# Patient Record
Sex: Female | Born: 1960 | Race: White | Hispanic: No | Marital: Married | Smoking: Never smoker
Health system: Southern US, Community
[De-identification: ages and names within clinical notes are randomized; demographics above are authoritative.]

## PROBLEM LIST (undated history)

## (undated) DIAGNOSIS — G4730 Sleep apnea, unspecified: Secondary | ICD-10-CM

## (undated) DIAGNOSIS — G608 Other hereditary and idiopathic neuropathies: Secondary | ICD-10-CM

## (undated) DIAGNOSIS — Z98811 Dental restoration status: Secondary | ICD-10-CM

## (undated) DIAGNOSIS — Z973 Presence of spectacles and contact lenses: Secondary | ICD-10-CM

## (undated) DIAGNOSIS — F419 Anxiety disorder, unspecified: Secondary | ICD-10-CM

## (undated) DIAGNOSIS — A6000 Herpesviral infection of urogenital system, unspecified: Secondary | ICD-10-CM

## (undated) DIAGNOSIS — E785 Hyperlipidemia, unspecified: Secondary | ICD-10-CM

## (undated) DIAGNOSIS — K5792 Diverticulitis of intestine, part unspecified, without perforation or abscess without bleeding: Secondary | ICD-10-CM

## (undated) DIAGNOSIS — F32A Depression, unspecified: Secondary | ICD-10-CM

## (undated) DIAGNOSIS — I10 Essential (primary) hypertension: Secondary | ICD-10-CM

## (undated) DIAGNOSIS — F329 Major depressive disorder, single episode, unspecified: Secondary | ICD-10-CM

## (undated) HISTORY — DX: Essential (primary) hypertension: I10

## (undated) HISTORY — DX: Herpesviral infection of urogenital system, unspecified: A60.00

## (undated) HISTORY — DX: Hyperlipidemia, unspecified: E78.5

## (undated) HISTORY — DX: Anxiety disorder, unspecified: F41.9

## (undated) HISTORY — DX: Depression, unspecified: F32.A

## (undated) HISTORY — DX: Diverticulitis of intestine, part unspecified, without perforation or abscess without bleeding: K57.92

## (undated) HISTORY — PX: TONSILLECTOMY: SUR1361

## (undated) HISTORY — DX: Other hereditary and idiopathic neuropathies: G60.8

## (undated) HISTORY — DX: Major depressive disorder, single episode, unspecified: F32.9

---

## 2004-05-16 ENCOUNTER — Ambulatory Visit: Payer: Self-pay | Admitting: Obstetrics and Gynecology

## 2005-06-12 ENCOUNTER — Ambulatory Visit: Payer: Self-pay | Admitting: Obstetrics and Gynecology

## 2008-06-23 ENCOUNTER — Ambulatory Visit: Payer: Self-pay | Admitting: Obstetrics and Gynecology

## 2010-07-20 ENCOUNTER — Ambulatory Visit: Payer: Self-pay | Admitting: Obstetrics and Gynecology

## 2010-07-21 ENCOUNTER — Ambulatory Visit: Payer: Self-pay | Admitting: Obstetrics and Gynecology

## 2011-08-09 ENCOUNTER — Ambulatory Visit: Payer: Self-pay | Admitting: Obstetrics and Gynecology

## 2011-09-17 LAB — HM PAP SMEAR

## 2011-12-27 ENCOUNTER — Ambulatory Visit: Payer: Self-pay | Admitting: Gastroenterology

## 2011-12-27 LAB — HM COLONOSCOPY

## 2014-10-11 ENCOUNTER — Other Ambulatory Visit: Payer: Self-pay | Admitting: Unknown Physician Specialty

## 2014-10-15 ENCOUNTER — Ambulatory Visit: Payer: Self-pay | Admitting: Unknown Physician Specialty

## 2014-10-15 DIAGNOSIS — F419 Anxiety disorder, unspecified: Secondary | ICD-10-CM | POA: Insufficient documentation

## 2014-10-15 DIAGNOSIS — F329 Major depressive disorder, single episode, unspecified: Secondary | ICD-10-CM

## 2014-10-15 DIAGNOSIS — I10 Essential (primary) hypertension: Secondary | ICD-10-CM | POA: Insufficient documentation

## 2014-10-15 DIAGNOSIS — E1159 Type 2 diabetes mellitus with other circulatory complications: Secondary | ICD-10-CM | POA: Insufficient documentation

## 2014-10-15 DIAGNOSIS — E785 Hyperlipidemia, unspecified: Secondary | ICD-10-CM | POA: Insufficient documentation

## 2014-10-15 DIAGNOSIS — A6000 Herpesviral infection of urogenital system, unspecified: Secondary | ICD-10-CM | POA: Insufficient documentation

## 2014-10-15 DIAGNOSIS — E1169 Type 2 diabetes mellitus with other specified complication: Secondary | ICD-10-CM | POA: Insufficient documentation

## 2014-10-15 DIAGNOSIS — F32A Depression, unspecified: Secondary | ICD-10-CM | POA: Insufficient documentation

## 2014-10-18 HISTORY — PX: DRAINAGE TUBE REMOVAL (ARMC HX): HXRAD1702

## 2014-11-03 ENCOUNTER — Encounter: Payer: Self-pay | Admitting: Unknown Physician Specialty

## 2014-12-07 ENCOUNTER — Ambulatory Visit (INDEPENDENT_AMBULATORY_CARE_PROVIDER_SITE_OTHER): Payer: BC Managed Care – PPO | Admitting: Unknown Physician Specialty

## 2014-12-07 ENCOUNTER — Encounter: Payer: Self-pay | Admitting: Unknown Physician Specialty

## 2014-12-07 VITALS — BP 90/72 | HR 91 | Temp 98.7°F | Ht 65.5 in | Wt 178.6 lb

## 2014-12-07 DIAGNOSIS — Z0000 Encounter for general adult medical examination without abnormal findings: Secondary | ICD-10-CM

## 2014-12-07 DIAGNOSIS — B354 Tinea corporis: Secondary | ICD-10-CM | POA: Diagnosis not present

## 2014-12-07 DIAGNOSIS — I10 Essential (primary) hypertension: Secondary | ICD-10-CM

## 2014-12-07 DIAGNOSIS — F329 Major depressive disorder, single episode, unspecified: Secondary | ICD-10-CM | POA: Diagnosis not present

## 2014-12-07 DIAGNOSIS — A6000 Herpesviral infection of urogenital system, unspecified: Secondary | ICD-10-CM

## 2014-12-07 DIAGNOSIS — F32A Depression, unspecified: Secondary | ICD-10-CM

## 2014-12-07 MED ORDER — ITRACONAZOLE 100 MG PO CAPS: 200.0000 mg | ORAL_CAPSULE | Freq: Every day | ORAL | Status: DC

## 2014-12-07 MED ORDER — LOSARTAN POTASSIUM-HCTZ 50-12.5 MG PO TABS: 1.0000 | ORAL_TABLET | Freq: Every day | ORAL | Status: DC

## 2014-12-07 MED ORDER — FLUOXETINE HCL 20 MG PO CAPS: 20.0000 mg | ORAL_CAPSULE | Freq: Every day | ORAL | Status: DC

## 2014-12-07 MED ORDER — VALACYCLOVIR HCL 500 MG PO TABS: 500.0000 mg | ORAL_TABLET | Freq: Every day | ORAL | Status: DC

## 2014-12-07 NOTE — Assessment & Plan Note (Addendum)
SBP 90.  Feels OK without dizzyness but lost 10 pounds while in the hospital.  Will decrease BP meds from Losartan/HCTZ 100/25 to 50/12.5.  She wants to keep the weight off

## 2014-12-07 NOTE — Patient Instructions (Signed)
Please do call to schedule your mammogram; the number to schedule one at either [facility] or [facility] is [phone]   

## 2014-12-07 NOTE — Assessment & Plan Note (Signed)
Chronic.  Refill Valtrex

## 2014-12-07 NOTE — Assessment & Plan Note (Signed)
Stable.  Refill Fluoxetine 

## 2014-12-07 NOTE — Progress Notes (Signed)
BP 90/72 mmHg  Pulse 91  Temp(Src) 98.7 F (37.1 C)  Ht 5' 5.5" (1.664 m)  Wt 178 lb 9.6 oz (81.012 kg)  BMI 29.26 kg/m2  SpO2 97%  LMP 01/11/2010 (Approximate)   Subjective:    Patient ID: Jo Martinez, female    DOB: [birth year], 54 y.o.   MRN: [id number]  HPI: Jo Martinez is a 54 y.o. female  Chief Complaint  Patient presents with  . Annual Exam   Was in the hospital for diverticulitis for a week in August.  She is recovered.  No surgery.    Hypertension This is a chronic problem. The current episode started today. The problem is unchanged. The problem is controlled. Pertinent negatives include no chest pain, neck pain, shortness of breath or sweats. The current treatment provides significant improvement. There are no compliance problems.    Depression Stable Depression screen PHQ 2/9 12/07/2014  Decreased Interest 0  Down, Depressed, Hopeless 0  PHQ - 2 Score 0     Relevant past medical, surgical, family and social history reviewed and updated as indicated. Interim medical history since our last visit reviewed. Allergies and medications reviewed and updated.  Review of Systems  Constitutional: Negative.   HENT: Negative.   Eyes: Negative.   Respiratory: Negative.  Negative for shortness of breath.   Cardiovascular: Negative.  Negative for chest pain.  Gastrointestinal: Negative.   Endocrine: Negative.   Genitourinary: Negative.   Musculoskeletal: Negative.  Negative for neck pain.  Skin:       Was given a cream for ringworm which seems to be Lotrisone.  It helped but stopped using it while in hospital  Allergic/Immunologic: Negative.   Neurological: Negative.   Hematological: Negative.   Psychiatric/Behavioral: Negative.     Per HPI unless specifically indicated above     Objective:    BP 90/72 mmHg  Pulse 91  Temp(Src) 98.7 F (37.1 C)  Ht 5' 5.5" (1.664 m)  Wt 178 lb 9.6 oz (81.012 kg)  BMI 29.26 kg/m2  SpO2 97%  LMP 01/11/2010  (Approximate)  Wt Readings from Last 3 Encounters:  12/07/14 178 lb 9.6 oz (81.012 kg)  05/04/14 185 lb (83.915 kg)    Physical Exam  Constitutional: She is oriented to person, place, and time. She appears well-developed and well-nourished.  HENT:  Head: Normocephalic and atraumatic.  Eyes: Pupils are equal, round, and reactive to light. Right eye exhibits no discharge. Left eye exhibits no discharge. No scleral icterus.  Neck: Normal range of motion. Neck supple. Carotid bruit is not present. No thyromegaly present.  Cardiovascular: Normal rate, regular rhythm and normal heart sounds.  Exam reveals no gallop and no friction rub.   No murmur heard. Pulmonary/Chest: Effort normal and breath sounds normal. No respiratory distress. She has no wheezes. She has no rales.  Abdominal: Soft. Bowel sounds are normal. There is no tenderness. There is no rebound.  Genitourinary: No breast swelling, tenderness or discharge.  Refusing pelvic  Musculoskeletal: Normal range of motion.  Lymphadenopathy:    She has no cervical adenopathy.  Neurological: She is alert and oriented to person, place, and time.  Skin: Skin is warm, dry and intact. No rash noted.  Multiple lesions on back and left side  Psychiatric: She has a normal mood and affect. Her speech is normal and behavior is normal. Judgment and thought content normal. Cognition and memory are normal.       Assessment & Plan:   Problem List  Items Addressed This Visit      Unprioritized   Hypertension    SBP 90.  Feels OK without dizzyness but lost 10 pounds while in the hospital.  Will decrease BP meds from Losartan/HCTZ 100/25 to 50/12.5.  She wants to keep the weight off      Relevant Medications   losartan-hydrochlorothiazide (HYZAAR) 50-12.5 MG tablet   Other Relevant Orders   Comprehensive metabolic panel   Lipid Panel w/o Chol/HDL Ratio   Depression    Stable.  Refill Fluoxetine      Relevant Medications   FLUoxetine (PROZAC)  20 MG capsule   Genital herpes    Chronic.  Refill Valtrex      Relevant Medications   itraconazole (SPORANOX) 100 MG capsule   valACYclovir (VALTREX) 500 MG tablet    Other Visit Diagnoses    Annual physical exam    -  Primary    Relevant Orders    CBC with Differential/Platelet    Comprehensive metabolic panel    Lipid Panel w/o Chol/HDL Ratio    TSH    HIV antibody    Hepatitis C antibody    MM DIGITAL SCREENING BILATERAL    Tinea corporis        Relevant Medications    itraconazole (SPORANOX) 100 MG capsule    valACYclovir (VALTREX) 500 MG tablet        Follow up plan: Return in about 3 months (around 03/09/2015).

## 2014-12-08 ENCOUNTER — Telehealth: Payer: Self-pay | Admitting: Unknown Physician Specialty

## 2014-12-08 DIAGNOSIS — D7589 Other specified diseases of blood and blood-forming organs: Secondary | ICD-10-CM

## 2014-12-08 DIAGNOSIS — R7301 Impaired fasting glucose: Secondary | ICD-10-CM

## 2014-12-08 LAB — CBC WITH DIFFERENTIAL/PLATELET
BASOS: 1 %
Basophils Absolute: 0 10*3/uL (ref 0.0–0.2)
EOS (ABSOLUTE): 0.2 10*3/uL (ref 0.0–0.4)
Eos: 2 %
HEMATOCRIT: 39.2 % (ref 34.0–46.6)
Hemoglobin: 13.7 g/dL (ref 11.1–15.9)
IMMATURE GRANS (ABS): 0 10*3/uL (ref 0.0–0.1)
Immature Granulocytes: 0 %
Lymphocytes Absolute: 3.7 10*3/uL — ABNORMAL HIGH (ref 0.7–3.1)
Lymphs: 42 %
MCH: 37 pg — AB (ref 26.6–33.0)
MCHC: 34.9 g/dL (ref 31.5–35.7)
MCV: 106 fL — AB (ref 79–97)
MONOCYTES: 7 %
Monocytes Absolute: 0.6 10*3/uL (ref 0.1–0.9)
NEUTROS ABS: 4.3 10*3/uL (ref 1.4–7.0)
Neutrophils: 48 %
PLATELETS: 255 10*3/uL (ref 150–379)
RBC: 3.7 x10E6/uL — ABNORMAL LOW (ref 3.77–5.28)
RDW: 15.8 % — ABNORMAL HIGH (ref 12.3–15.4)
WBC: 8.8 10*3/uL (ref 3.4–10.8)

## 2014-12-08 LAB — COMPREHENSIVE METABOLIC PANEL
A/G RATIO: 2.2 (ref 1.1–2.5)
ALBUMIN: 4.3 g/dL (ref 3.5–5.5)
ALT: 14 IU/L (ref 0–32)
AST: 18 IU/L (ref 0–40)
Alkaline Phosphatase: 63 IU/L (ref 39–117)
BILIRUBIN TOTAL: 0.6 mg/dL (ref 0.0–1.2)
BUN / CREAT RATIO: 12 (ref 9–23)
BUN: 11 mg/dL (ref 6–24)
CHLORIDE: 98 mmol/L (ref 97–108)
CO2: 23 mmol/L (ref 18–29)
Calcium: 9.1 mg/dL (ref 8.7–10.2)
Creatinine, Ser: 0.89 mg/dL (ref 0.57–1.00)
GFR calc non Af Amer: 75 mL/min/{1.73_m2} (ref >59)
GFR, EST AFRICAN AMERICAN: 86 mL/min/{1.73_m2} (ref >59)
GLOBULIN, TOTAL: 2 g/dL (ref 1.5–4.5)
Glucose: 149 mg/dL — ABNORMAL HIGH (ref 65–99)
POTASSIUM: 3.3 mmol/L — AB (ref 3.5–5.2)
SODIUM: 140 mmol/L (ref 134–144)
TOTAL PROTEIN: 6.3 g/dL (ref 6.0–8.5)

## 2014-12-08 LAB — LIPID PANEL W/O CHOL/HDL RATIO
Cholesterol, Total: 223 mg/dL — ABNORMAL HIGH (ref 100–199)
HDL: 41 mg/dL (ref >39)
LDL Calculated: 122 mg/dL — ABNORMAL HIGH (ref 0–99)
Triglycerides: 301 mg/dL — ABNORMAL HIGH (ref 0–149)
VLDL Cholesterol Cal: 60 mg/dL — ABNORMAL HIGH (ref 5–40)

## 2014-12-08 LAB — HIV ANTIBODY (ROUTINE TESTING W REFLEX): HIV Screen 4th Generation wRfx: NONREACTIVE

## 2014-12-08 LAB — TSH: TSH: 4.11 u[IU]/mL (ref 0.450–4.500)

## 2014-12-08 LAB — HEPATITIS C ANTIBODY: Hep C Virus Ab: <0.1 s/co ratio (ref 0.0–0.9)

## 2014-12-08 NOTE — Telephone Encounter (Signed)
Discussed labs with patient.  We need a Hgb A1C, B12, and folate.  She will come to get those drawn.  Continue to monitor cholesterol at next visit.

## 2014-12-14 ENCOUNTER — Other Ambulatory Visit: Payer: BC Managed Care – PPO

## 2014-12-14 DIAGNOSIS — D7589 Other specified diseases of blood and blood-forming organs: Secondary | ICD-10-CM

## 2014-12-14 DIAGNOSIS — R7301 Impaired fasting glucose: Secondary | ICD-10-CM

## 2014-12-14 LAB — BAYER DCA HB A1C WAIVED: HB A1C (BAYER DCA - WAIVED): 5.5 % (ref <7.0)

## 2014-12-15 ENCOUNTER — Encounter: Payer: Self-pay | Admitting: Unknown Physician Specialty

## 2014-12-15 ENCOUNTER — Telehealth: Payer: Self-pay

## 2014-12-15 DIAGNOSIS — D519 Vitamin B12 deficiency anemia, unspecified: Secondary | ICD-10-CM

## 2014-12-15 LAB — VITAMIN B12: VITAMIN B 12: 165 pg/mL — AB (ref 211–946)

## 2014-12-15 LAB — FOLATE

## 2014-12-15 MED ORDER — CYANOCOBALAMIN 1000 MCG/ML IJ SOLN: 1000.0000 ug | Freq: Once | INTRAMUSCULAR | Status: DC

## 2014-12-15 NOTE — Telephone Encounter (Signed)
Patient called and stated she was returning Jo Martinez's call.

## 2014-12-15 NOTE — Telephone Encounter (Signed)
Discussed with pt she has a B12 deficiency.  I recommend weekly injection for 4 weeks and then monthly

## 2014-12-16 ENCOUNTER — Other Ambulatory Visit: Payer: Self-pay | Admitting: Unknown Physician Specialty

## 2014-12-17 ENCOUNTER — Encounter: Payer: Self-pay | Admitting: Unknown Physician Specialty

## 2014-12-27 ENCOUNTER — Other Ambulatory Visit: Payer: Self-pay | Admitting: Unknown Physician Specialty

## 2014-12-27 ENCOUNTER — Encounter: Payer: Self-pay | Admitting: Unknown Physician Specialty

## 2014-12-27 MED ORDER — TERBINAFINE HCL 250 MG PO TABS: 250.0000 mg | ORAL_TABLET | Freq: Every day | ORAL | Status: DC

## 2015-02-15 ENCOUNTER — Other Ambulatory Visit: Payer: Self-pay | Admitting: Unknown Physician Specialty

## 2015-03-28 ENCOUNTER — Other Ambulatory Visit: Payer: Self-pay | Admitting: Unknown Physician Specialty

## 2015-03-28 ENCOUNTER — Encounter: Payer: Self-pay | Admitting: Unknown Physician Specialty

## 2015-03-28 MED ORDER — ITRACONAZOLE 100 MG PO CAPS: 200.0000 mg | ORAL_CAPSULE | Freq: Every day | ORAL | Status: DC

## 2015-03-29 ENCOUNTER — Other Ambulatory Visit: Payer: Self-pay | Admitting: Unknown Physician Specialty

## 2015-03-29 MED ORDER — ITRACONAZOLE 100 MG PO CAPS: 200.0000 mg | ORAL_CAPSULE | Freq: Every day | ORAL | Status: DC

## 2015-03-29 NOTE — Telephone Encounter (Signed)
Pt needs sporanox sent to [facility] [address].  new mail order service is [facility] not [facility]

## 2015-03-29 NOTE — Telephone Encounter (Signed)
Routing to provider  

## 2015-04-19 ENCOUNTER — Encounter: Payer: Self-pay | Admitting: Unknown Physician Specialty

## 2015-04-19 ENCOUNTER — Ambulatory Visit (INDEPENDENT_AMBULATORY_CARE_PROVIDER_SITE_OTHER): Payer: BC Managed Care – PPO | Admitting: Unknown Physician Specialty

## 2015-04-19 VITALS — BP 145/88 | HR 91 | Temp 99.1°F | Ht 65.2 in | Wt 176.2 lb

## 2015-04-19 DIAGNOSIS — B354 Tinea corporis: Secondary | ICD-10-CM | POA: Diagnosis not present

## 2015-04-19 DIAGNOSIS — I10 Essential (primary) hypertension: Secondary | ICD-10-CM

## 2015-04-19 MED ORDER — LOSARTAN POTASSIUM-HCTZ 100-25 MG PO TABS: 1.0000 | ORAL_TABLET | Freq: Every day | ORAL | Status: DC

## 2015-04-19 MED ORDER — ITRACONAZOLE 100 MG PO CAPS: 200.0000 mg | ORAL_CAPSULE | Freq: Every day | ORAL | Status: DC

## 2015-04-19 NOTE — Assessment & Plan Note (Signed)
Not to goal.  Will increase to Hyzaar 100/25 mg

## 2015-04-19 NOTE — Progress Notes (Signed)
   BP 145/88 mmHg  Pulse 91  Temp(Src) 99.1 F (37.3 C)  Ht 5' 5.2" (1.656 m)  Wt 176 lb 3.2 oz (79.924 kg)  BMI 29.14 kg/m2  SpO2 98%  LMP 01/16/2010 (Approximate)   Subjective:    Patient ID: Jo Martinez, female    DOB: 1960/07/21, 55 y.o.   MRN: [id number]  HPI: Jo Martinez is a 55 y.o. female  Chief Complaint  Patient presents with  . Hypertension  . Medication Refill    pt states she needs refill on losartan hctz and itraconazole   Hypertension Using medications without difficulty Average home BPs "not checking"  No problems or lightheadedness No chest pain with exertion or shortness of breath No Edema  Ringworm Gave Itraconazole last visit due to multiple areas of ringworm.  She said it alsmost went away but has come back.  She has had a round of Itraconazole.    Relevant past medical, surgical, family and social history reviewed and updated as indicated. Interim medical history since our last visit reviewed. Allergies and medications reviewed and updated.  Review of Systems  Per HPI unless specifically indicated above     Objective:    BP 145/88 mmHg  Pulse 91  Temp(Src) 99.1 F (37.3 C)  Ht 5' 5.2" (1.656 m)  Wt 176 lb 3.2 oz (79.924 kg)  BMI 29.14 kg/m2  SpO2 98%  LMP 01/16/2010 (Approximate)  Wt Readings from Last 3 Encounters:  04/19/15 176 lb 3.2 oz (79.924 kg)  12/07/14 178 lb 9.6 oz (81.012 kg)  05/04/14 185 lb (83.915 kg)    Physical Exam  Constitutional: She is oriented to person, place, and time. She appears well-developed and well-nourished. No distress.  HENT:  Head: Normocephalic and atraumatic.  Eyes: Conjunctivae and lids are normal. Right eye exhibits no discharge. Left eye exhibits no discharge. No scleral icterus.  Neck: Normal range of motion. Neck supple. No JVD present. Carotid bruit is not present.  Cardiovascular: Normal rate, regular rhythm and normal heart sounds.   Pulmonary/Chest: Effort normal and breath sounds  normal.  Abdominal: Normal appearance. There is no splenomegaly or hepatomegaly.  Musculoskeletal: Normal range of motion.  Neurological: She is alert and oriented to person, place, and time.  Skin: Skin is warm, dry and intact. Rash noted. No pallor.  Tinea under breasts and upper legs  Psychiatric: She has a normal mood and affect. Her behavior is normal. Judgment and thought content normal.      Assessment & Plan:   Problem List Items Addressed This Visit      Unprioritized   Hypertension - Primary    Not to goal.  Will increase to Hyzaar 100/25 mg      Relevant Medications   losartan-hydrochlorothiazide (HYZAAR) 100-25 MG tablet   Ringworm of body    Refill Sporanox.  Pt does not want to consider another option      Relevant Medications   itraconazole (SPORANOX) 100 MG capsule      Last Hgb A1C was 5.5  Follow up plan: Return in about 4 weeks (around 05/17/2015).

## 2015-04-19 NOTE — Assessment & Plan Note (Signed)
Refill Sporanox.  Pt does not want to consider another option

## 2015-05-15 ENCOUNTER — Other Ambulatory Visit: Payer: Self-pay | Admitting: Family Medicine

## 2015-06-24 ENCOUNTER — Ambulatory Visit (INDEPENDENT_AMBULATORY_CARE_PROVIDER_SITE_OTHER): Payer: BC Managed Care – PPO | Admitting: Unknown Physician Specialty

## 2015-06-24 ENCOUNTER — Encounter: Payer: Self-pay | Admitting: Unknown Physician Specialty

## 2015-06-24 VITALS — BP 132/86 | HR 69 | Temp 98.4°F | Ht 65.5 in | Wt 173.8 lb

## 2015-06-24 DIAGNOSIS — K5732 Diverticulitis of large intestine without perforation or abscess without bleeding: Secondary | ICD-10-CM | POA: Diagnosis not present

## 2015-06-24 NOTE — Progress Notes (Signed)
BP 132/86 mmHg  Pulse 69  Temp(Src) 98.4 F (36.9 C)  Ht 5' 5.5" (1.664 m)  Wt 173 lb 12.8 oz (78.835 kg)  BMI 28.47 kg/m2  SpO2 97%  LMP 01/16/2010 (Approximate)   Subjective:    Patient ID: Jo Martinez, female    DOB: [birth year], 55 y.o.   MRN: [id number]  HPI: Jo Martinez is a 55 y.o. female  Chief Complaint  Patient presents with  . Hospitalization Follow-up    pt [facility]    Patient present in clinic for f/u from [facility] related to diverticulosis. The patient reports the pain localized at the lower middle abdomen with nausea. Patient was given ciprofloxacin and metronidazole for treatment. Patient reports today is the last day of antibiotic regimen. Patient reports improvement in abdomenal pain; however,c/o GI upset from the antibiotic adverse effects.   Abdominal Pain: Resolved abdominal pain from ER visitation Denies fevers or chills Reports nausea and diarrhea due to antibiotics. Denies generalized weakness or near syncope Last colonoscopy was 4 years ago. This is her second episode. First episode was in August 2016 with 5 day hospitalization.   Relevant past medical, surgical, family and social history reviewed and updated as indicated. Interim medical history since our last visit reviewed. Allergies and medications reviewed and updated.  Review of Systems  Constitutional: Negative for fever, activity change, appetite change, fatigue and unexpected weight change.  Respiratory: Negative.   Cardiovascular: Negative.   Gastrointestinal: Positive for nausea and diarrhea. Negative for vomiting, abdominal pain, blood in stool and abdominal distention.  Genitourinary: Negative.   Psychiatric/Behavioral: Negative.   All other systems reviewed and are negative.   Per HPI unless specifically indicated above     Objective:    BP 132/86 mmHg  Pulse 69  Temp(Src) 98.4 F (36.9 C)  Ht  5' 5.5" (1.664 m)  Wt 173 lb 12.8 oz (78.835 kg)  BMI 28.47 kg/m2  SpO2 97%  LMP 01/16/2010 (Approximate)  Wt Readings from Last 3 Encounters:  06/24/15 173 lb 12.8 oz (78.835 kg)  04/19/15 176 lb 3.2 oz (79.924 kg)  12/07/14 178 lb 9.6 oz (81.012 kg)    Physical Exam  Constitutional: She is oriented to person, place, and time. She appears well-developed and well-nourished. No distress.  HENT:  Head: Normocephalic and atraumatic.  Eyes: Conjunctivae and EOM are normal. Pupils are equal, round, and reactive to light.  Neck: Normal range of motion.  Cardiovascular: Normal rate, regular rhythm, normal heart sounds and intact distal pulses.   Pulmonary/Chest: Effort normal and breath sounds normal. No respiratory distress. She exhibits no tenderness.  Abdominal: Soft. Bowel sounds are normal. She exhibits no distension and no mass. There is no tenderness. There is no rebound.  Musculoskeletal: Normal range of motion.  Neurological: She is alert and oriented to person, place, and time.  Skin: Skin is warm and dry.  Psychiatric: She has a normal mood and affect. Her behavior is normal. Judgment and thought content normal.    Results for orders placed or performed in visit on 12/14/14  Vitamin B12  Result Value Ref Range   Vitamin B-12 165 (L) 211 - 946 pg/mL  Folate  Result Value Ref Range   Folate <2.0 (L) >3.0 ng/mL  Bayer DCA Hb A1c Waived  Result Value Ref Range   Bayer DCA Hb A1c Waived 5.5 <7.0 %      Assessment &  Plan:   Problem List Items Addressed This Visit      Unprioritized   Diverticulitis of large intestine without perforation or abscess without bleeding - Primary    Reviewed the documents from ER visit with noted Diverticulitis confirmed on Xray and CT scan. Patient reports improvement. Referred to GI for further evaluation.      Relevant Medications   ciprofloxacin (CIPRO) 500 MG tablet   metroNIDAZOLE (FLAGYL) 500 MG tablet   Other Relevant Orders    Ambulatory referral to Gastroenterology       Follow up plan: Return if symptoms worsen or fail to improve.

## 2015-06-24 NOTE — Assessment & Plan Note (Addendum)
Reviewed the documents from ER visit with noted Diverticulitis confirmed on Xray and CT scan. Patient reports improvement. Referred to GI for further evaluation.

## 2015-07-05 ENCOUNTER — Encounter: Payer: Self-pay | Admitting: *Deleted

## 2015-07-06 ENCOUNTER — Encounter
Admission: RE | Disposition: A | Discharge status: Home / Self Care | Payer: Self-pay | Source: Ambulatory Visit | Attending: Gastroenterology

## 2015-07-06 ENCOUNTER — Ambulatory Visit
Admission: RE | Admit: 2015-07-06 | Discharge: 2015-07-06 | Disposition: A | Discharge status: Home / Self Care | Payer: BC Managed Care – PPO | Source: Ambulatory Visit | Attending: Gastroenterology | Admitting: Gastroenterology

## 2015-07-06 ENCOUNTER — Ambulatory Visit: Payer: BC Managed Care – PPO | Admitting: Anesthesiology

## 2015-07-06 DIAGNOSIS — Z79899 Other long term (current) drug therapy: Secondary | ICD-10-CM | POA: Diagnosis not present

## 2015-07-06 DIAGNOSIS — K5730 Diverticulosis of large intestine without perforation or abscess without bleeding: Secondary | ICD-10-CM | POA: Diagnosis present

## 2015-07-06 DIAGNOSIS — I10 Essential (primary) hypertension: Secondary | ICD-10-CM | POA: Insufficient documentation

## 2015-07-06 DIAGNOSIS — F329 Major depressive disorder, single episode, unspecified: Secondary | ICD-10-CM | POA: Insufficient documentation

## 2015-07-06 DIAGNOSIS — G4730 Sleep apnea, unspecified: Secondary | ICD-10-CM | POA: Diagnosis not present

## 2015-07-06 DIAGNOSIS — Z807 Family history of other malignant neoplasms of lymphoid, hematopoietic and related tissues: Secondary | ICD-10-CM | POA: Insufficient documentation

## 2015-07-06 HISTORY — DX: Presence of spectacles and contact lenses: Z97.3

## 2015-07-06 HISTORY — DX: Sleep apnea, unspecified: G47.30

## 2015-07-06 HISTORY — PX: COLONOSCOPY: SHX5424

## 2015-07-06 HISTORY — DX: Dental restoration status: Z98.811

## 2015-07-06 SURGERY — COLONOSCOPY
Anesthesia: Monitor Anesthesia Care | Wound class: Contaminated

## 2015-07-06 MED ORDER — PROPOFOL 10 MG/ML IV BOLUS
INTRAVENOUS | Status: DC | PRN
  Administered 2015-07-06 (×2): 10 mg via INTRAVENOUS
  Administered 2015-07-06: 100 mg via INTRAVENOUS
  Administered 2015-07-06 (×2): 50 mg via INTRAVENOUS
  Administered 2015-07-06: 30 mg via INTRAVENOUS
  Administered 2015-07-06: 20 mg via INTRAVENOUS
  Administered 2015-07-06: 10 mg via INTRAVENOUS
  Administered 2015-07-06: 20 mg via INTRAVENOUS

## 2015-07-06 MED ORDER — LACTATED RINGERS IV SOLN
INTRAVENOUS | Status: DC
  Administered 2015-07-06 (×2): via INTRAVENOUS

## 2015-07-06 MED ORDER — STERILE WATER FOR IRRIGATION IR SOLN
Status: DC | PRN
  Administered 2015-07-06: 11:00:00

## 2015-07-06 MED ORDER — LIDOCAINE HCL (CARDIAC) 20 MG/ML IV SOLN
INTRAVENOUS | Status: DC | PRN
  Administered 2015-07-06: 20 mg via INTRAVENOUS

## 2015-07-06 MED ORDER — SODIUM CHLORIDE 0.9 % IV SOLN: INTRAVENOUS | Status: DC

## 2015-07-06 SURGICAL SUPPLY — 30 items

## 2015-07-06 NOTE — H&P (Signed)
  Date of Initial H&P: 07/04/2015  History reviewed, patient examined, no change in status, stable for surgery. 

## 2015-07-06 NOTE — Transfer of Care (Signed)
Immediate Anesthesia Transfer of Care Note  Patient: Jo DuffelDenise M Vanderpool  Procedure(s) Performed: Procedure(s) with comments: COLONOSCOPY (N/A) - SLEEP APNEA - no CPAP  Patient Location: PACU  Anesthesia Type: MAC  Level of Consciousness: awake, alert  and patient cooperative  Airway and Oxygen Therapy: Patient Spontanous Breathing and Patient connected to supplemental oxygen  Post-op Assessment: Post-op Vital signs reviewed, Patient's Cardiovascular Status Stable, Respiratory Function Stable, Patent Airway and No signs of Nausea or vomiting  Post-op Vital Signs: Reviewed and stable  Complications: No apparent anesthesia complications

## 2015-07-06 NOTE — Anesthesia Preprocedure Evaluation (Signed)
Anesthesia Evaluation  Patient identified by MRN, date of birth, ID band Patient awake    Reviewed: Allergy & Precautions, H&P , NPO status , Patient's Chart, lab work & pertinent test results, reviewed documented beta blocker date and time   Airway Mallampati: II  TM Distance: >3 FB Neck ROM: full    Dental no notable dental hx.    Pulmonary sleep apnea ,    Pulmonary exam normal breath sounds clear to auscultation       Cardiovascular Exercise Tolerance: Good hypertension,  Rhythm:regular Rate:Normal     Neuro/Psych PSYCHIATRIC DISORDERS (depression, anxiety) negative neurological ROS     GI/Hepatic negative GI ROS, Neg liver ROS,   Endo/Other  negative endocrine ROS  Renal/GU negative Renal ROS  negative genitourinary   Musculoskeletal   Abdominal   Peds  Hematology negative hematology ROS (+)   Anesthesia Other Findings   Reproductive/Obstetrics negative OB ROS                             Anesthesia Physical Anesthesia Plan  ASA: II  Anesthesia Plan: MAC   Post-op Pain Management:    Induction:   Airway Management Planned:   Additional Equipment:   Intra-op Plan:   Post-operative Plan:   Informed Consent: I have reviewed the patients History and Physical, chart, labs and discussed the procedure including the risks, benefits and alternatives for the proposed anesthesia with the patient or authorized representative who has indicated his/her understanding and acceptance.   Dental Advisory Given  Plan Discussed with: CRNA  Anesthesia Plan Comments:         Anesthesia Quick Evaluation

## 2015-07-06 NOTE — Anesthesia Postprocedure Evaluation (Signed)
Anesthesia Post Note  Patient: Jo Martinez  Procedure(s) Performed: Procedure(s) (LRB): COLONOSCOPY (N/A)  Patient location during evaluation: PACU Anesthesia Type: MAC Level of consciousness: awake and alert Pain management: pain level controlled Vital Signs Assessment: post-procedure vital signs reviewed and stable Respiratory status: spontaneous breathing, nonlabored ventilation, respiratory function stable and patient connected to nasal cannula oxygen Cardiovascular status: blood pressure returned to baseline and stable Postop Assessment: no signs of nausea or vomiting Anesthetic complications: no    Scarlette Sliceachel B Beach

## 2015-07-06 NOTE — Op Note (Signed)
[facility] Gastroenterology Patient Name: Diavion Labrador Procedure Date: 07/06/2015 10:40 AM MRN: [id number] Account #: [id number] Date of Birth: January 01, 1961 Admit Type: Outpatient Age: 55 Room: [facility] OR ROOM 01 Gender: Female Note Status: Finalized Procedure:            Colonoscopy Indications:          Follow-up of diverticulitis Providers:            Ezzard Standing. Bluford Kaufmann, MD Referring MD:         Gabriel Cirri, PA (Referring MD) Medicines:            Monitored Anesthesia Care Complications:        No immediate complications. Procedure:            Pre-Anesthesia Assessment:                       - Prior to the procedure, a History and Physical was                        performed, and patient medications, allergies and                        sensitivities were reviewed. The patient's tolerance of                        previous anesthesia was reviewed.                       - The risks and benefits of the procedure and the                        sedation options and risks were discussed with the                        patient. All questions were answered and informed                        consent was obtained.                       - After reviewing the risks and benefits, the patient                        was deemed in satisfactory condition to undergo the                        procedure.                       After obtaining informed consent, the colonoscope was                        passed under direct vision. Throughout the procedure,                        the patient's blood pressure, pulse, and oxygen                        saturations were monitored continuously. The Olympus CF  H180AL colonoscope (S#: [id number]) was introduced through                        the anus and advanced to the the cecum, identified by                        appendiceal orifice and ileocecal valve. The                        colonoscopy was performed without  difficulty. The                        patient tolerated the procedure well. The quality of                        the bowel preparation was fair. Findings:      A few small-mouthed diverticula were found in the sigmoid colon,       descending colon and ascending colon.      The exam was otherwise without abnormality. Impression:           - Preparation of the colon was fair.                       - Diverticulosis in the sigmoid colon, in the                        descending colon and in the ascending colon.                       - The examination was otherwise normal.                       - No specimens collected. Recommendation:       - Discharge patient to home.                       - Repeat colonoscopy in 10 years for surveillance.                       - The findings and recommendations were discussed with                        the patient.                       - High fiber diet.                       - Avoid seeds and nuts Procedure Code(s):    --- Professional ---                       [phone], Colonoscopy, flexible; diagnostic, including                        collection of specimen(s) by brushing or washing, when                        performed (separate procedure) Diagnosis Code(s):    --- Professional ---  K57.32, Diverticulitis of large intestine without                        perforation or abscess without bleeding                       K57.30, Diverticulosis of large intestine without                        perforation or abscess without bleeding CPT copyright 2016 American Medical Association. All rights reserved. The codes documented in this report are preliminary and upon coder review may  be revised to meet current compliance requirements. Wallace CullensPaul Y Fremont Skalicky, MD 07/06/2015 10:58:00 AM This report has been signed electronically. Number of Addenda: 0 Note Initiated On: 07/06/2015 10:40 AM Scope Withdrawal Time: 0 hours 6 minutes 9 seconds  Total  Procedure Duration: 0 hours 8 minutes 45 seconds       [facility]

## 2015-07-06 NOTE — Discharge Instructions (Signed)

## 2015-07-06 NOTE — Anesthesia Procedure Notes (Signed)
Procedure Name: MAC Performed by: Larell Baney Pre-anesthesia Checklist: Patient identified, Emergency Drugs available, Suction available, Patient being monitored and Timeout performed Patient Re-evaluated:Patient Re-evaluated prior to inductionOxygen Delivery Method: Nasal cannula       

## 2015-07-07 ENCOUNTER — Encounter: Payer: Self-pay | Admitting: Gastroenterology

## 2015-07-11 ENCOUNTER — Telehealth: Payer: Self-pay | Admitting: Unknown Physician Specialty

## 2015-07-11 MED ORDER — TERBINAFINE HCL 1 % EX CREA: 1.0000 "application " | TOPICAL_CREAM | Freq: Two times a day (BID) | CUTANEOUS | Status: DC

## 2015-07-11 NOTE — Telephone Encounter (Signed)
Patient called in needs refill or something called in for a cream for her ringworm to her pharmacy [facility] [address]

## 2015-07-11 NOTE — Telephone Encounter (Signed)
Routing to provider  

## 2015-07-14 ENCOUNTER — Other Ambulatory Visit: Payer: Self-pay

## 2015-07-14 ENCOUNTER — Encounter: Payer: Self-pay | Admitting: Unknown Physician Specialty

## 2015-07-15 MED ORDER — ITRACONAZOLE 100 MG PO CAPS: 100.0000 mg | ORAL_CAPSULE | Freq: Every day | ORAL | Status: DC

## 2015-08-12 ENCOUNTER — Other Ambulatory Visit: Payer: Self-pay | Admitting: Unknown Physician Specialty

## 2015-08-12 ENCOUNTER — Telehealth: Payer: Self-pay | Admitting: Unknown Physician Specialty

## 2015-08-12 NOTE — Telephone Encounter (Signed)
Routing to provider  

## 2015-08-12 NOTE — Telephone Encounter (Signed)
Patient notified

## 2015-08-12 NOTE — Telephone Encounter (Signed)
[facility] would like [facility]s [facility] [address] [facility] diveriticul[facility].

## 2015-08-12 NOTE — Telephone Encounter (Signed)
I called it in but pt needs to be seen on Monday

## 2015-08-15 ENCOUNTER — Encounter: Payer: Self-pay | Admitting: Unknown Physician Specialty

## 2015-08-15 ENCOUNTER — Ambulatory Visit (INDEPENDENT_AMBULATORY_CARE_PROVIDER_SITE_OTHER): Payer: BC Managed Care – PPO | Admitting: Unknown Physician Specialty

## 2015-08-15 VITALS — BP 116/79 | HR 83 | Temp 98.7°F | Ht 65.6 in | Wt 176.0 lb

## 2015-08-15 DIAGNOSIS — K5732 Diverticulitis of large intestine without perforation or abscess without bleeding: Secondary | ICD-10-CM

## 2015-08-15 MED ORDER — FLUOXETINE HCL 20 MG PO CAPS: 20.0000 mg | ORAL_CAPSULE | Freq: Every day | ORAL | Status: DC

## 2015-08-15 MED ORDER — VALACYCLOVIR HCL 500 MG PO TABS: 500.0000 mg | ORAL_TABLET | Freq: Every day | ORAL | Status: DC

## 2015-08-15 NOTE — Progress Notes (Signed)
BP 116/79 mmHg  Pulse 83  Temp(Src) 98.7 F (37.1 C)  Ht 5' 5.6" (1.666 m)  Wt 176 lb (79.833 kg)  BMI 28.76 kg/m2  SpO2 97%  LMP 01/16/2010 (Approximate)   Subjective:    Patient ID: Pamala Duffelenise M Bianca, female    DOB: August 12, 1960, 55 y.o.   MRN: [id number]  HPI: Pamala DuffelDenise M Schrag is a 55 y.o. female  Chief Complaint  Patient presents with  . Diverticulitis  . Medication Problem    pt states she needs prozac and valtrex refilled   Divertivulitis This is a recurrent problem and I gave her Cipro/Flagyl and she is better.  She is trying to eat a high fiber diet and avoid nuts and seeds.  She is doing better now.    Depression Doing well on present treatment.   [facility] 08/15/2015 12/07/2014  Decreased Interest 0 0  Down, Depressed, Hopeless 0 0  PHQ - 2 Score 0 0      Relevant past medical, surgical, family and social history reviewed and updated as indicated. Interim medical history since our last visit reviewed. Allergies and medications reviewed and updated.  Review of Systems  Constitutional: Negative.   HENT: Negative.   Eyes: Negative.   Respiratory: Negative.   Cardiovascular: Negative.   Gastrointestinal: Negative.   Endocrine: Negative.   Genitourinary: Negative.   Musculoskeletal: Negative.   Skin: Negative.   Allergic/Immunologic: Negative.   Neurological: Negative.   Hematological: Negative.   Psychiatric/Behavioral: Negative.     Per HPI unless specifically indicated above     Objective:    BP 116/79 mmHg  Pulse 83  Temp(Src) 98.7 F (37.1 C)  Ht 5' 5.6" (1.666 m)  Wt 176 lb (79.833 kg)  BMI 28.76 kg/m2  SpO2 97%  LMP 01/16/2010 (Approximate)  Wt Readings from Last 3 Encounters:  08/15/15 176 lb (79.833 kg)  07/06/15 173 lb (78.472 kg)  06/24/15 173 lb 12.8 oz (78.835 kg)    Physical Exam  Constitutional: She is oriented to person, place, and time. She appears well-developed and well-nourished. No distress.  HENT:   Head: Normocephalic and atraumatic.  Eyes: Conjunctivae and lids are normal. Right eye exhibits no discharge. Left eye exhibits no discharge. No scleral icterus.  Neck: Normal range of motion. Neck supple. No JVD present. Carotid bruit is not present.  Cardiovascular: Normal rate, regular rhythm and normal heart sounds.   Pulmonary/Chest: Effort normal and breath sounds normal.  Abdominal: Normal appearance. There is no splenomegaly or hepatomegaly.  Musculoskeletal: Normal range of motion.  Neurological: She is alert and oriented to person, place, and time.  Skin: Skin is warm, dry and intact. No rash noted. No pallor.  Psychiatric: She has a normal mood and affect. Her behavior is normal. Judgment and thought content normal.    Results for orders placed or performed in visit on 12/14/14  Vitamin B12  Result Value Ref Range   Vitamin B-12 165 (L) 211 - 946 pg/mL  Folate  Result Value Ref Range   Folate <2.0 (L) >3.0 ng/mL  Bayer DCA Hb A1c Waived  Result Value Ref Range   Bayer DCA Hb A1c Waived 5.5 <7.0 %      Assessment & Plan:   Problem List Items Addressed This Visit      Unprioritized   Diverticulitis of large intestine without perforation or abscess without bleeding - Primary    Recurrent.  3rd episodes in 9 months.  [facility]  Will make own appointment.            Follow up plan: Return for physical.

## 2015-08-15 NOTE — Assessment & Plan Note (Signed)
Recurrent.  3rd episodes in 9 months.  Will refer to to [facility] surgery where she is established.  Will make own appointment.

## 2015-11-03 HISTORY — PX: COLON SURGERY: SHX602

## 2015-11-19 ENCOUNTER — Other Ambulatory Visit: Payer: Self-pay | Admitting: Unknown Physician Specialty

## 2015-12-01 NOTE — Telephone Encounter (Signed)
[facility] called requesting Lostortin HCTZ;

## 2016-04-28 ENCOUNTER — Other Ambulatory Visit: Payer: Self-pay | Admitting: Unknown Physician Specialty

## 2016-07-29 ENCOUNTER — Other Ambulatory Visit: Payer: Self-pay | Admitting: Unknown Physician Specialty

## 2016-09-11 ENCOUNTER — Encounter: Payer: Self-pay | Admitting: Unknown Physician Specialty

## 2016-09-11 ENCOUNTER — Ambulatory Visit (INDEPENDENT_AMBULATORY_CARE_PROVIDER_SITE_OTHER): Payer: BC Managed Care – PPO | Admitting: Unknown Physician Specialty

## 2016-09-11 VITALS — BP 140/96 | HR 64 | Temp 99.0°F | Ht 65.2 in | Wt 179.7 lb

## 2016-09-11 DIAGNOSIS — F325 Major depressive disorder, single episode, in full remission: Secondary | ICD-10-CM | POA: Diagnosis not present

## 2016-09-11 DIAGNOSIS — L308 Other specified dermatitis: Secondary | ICD-10-CM | POA: Diagnosis not present

## 2016-09-11 DIAGNOSIS — Z0000 Encounter for general adult medical examination without abnormal findings: Secondary | ICD-10-CM | POA: Diagnosis not present

## 2016-09-11 DIAGNOSIS — A6000 Herpesviral infection of urogenital system, unspecified: Secondary | ICD-10-CM

## 2016-09-11 DIAGNOSIS — I10 Essential (primary) hypertension: Secondary | ICD-10-CM

## 2016-09-11 DIAGNOSIS — L309 Dermatitis, unspecified: Secondary | ICD-10-CM | POA: Insufficient documentation

## 2016-09-11 MED ORDER — FLUOXETINE HCL 20 MG PO CAPS: 20.0000 mg | ORAL_CAPSULE | Freq: Every day | ORAL | 3 refills | Status: DC

## 2016-09-11 MED ORDER — LOSARTAN POTASSIUM-HCTZ 100-25 MG PO TABS: 1.0000 | ORAL_TABLET | Freq: Every day | ORAL | 1 refills | Status: DC

## 2016-09-11 MED ORDER — VALACYCLOVIR HCL 500 MG PO TABS: 500.0000 mg | ORAL_TABLET | Freq: Every day | ORAL | 3 refills | Status: DC

## 2016-09-11 NOTE — Patient Instructions (Addendum)
DASH Eating Plan DASH stands for "Dietary Approaches to Stop Hypertension." The DASH eating plan is a healthy eating plan that has been shown to reduce high blood pressure (hypertension). It may also reduce your risk for type 2 diabetes, heart disease, and stroke. The DASH eating plan may also help with weight loss. What are tips for following this plan? General guidelines  Avoid eating more than 2,300 mg (milligrams) of salt (sodium) a day. If you have hypertension, you may need to reduce your sodium intake to 1,500 mg a day.  Limit alcohol intake to no more than 1 drink a day for nonpregnant women and 2 drinks a day for men. One drink equals 12 oz of beer, 5 oz of wine, or 1 oz of hard liquor.  Work with your health care provider to maintain a healthy body weight or to lose weight. Ask what an ideal weight is for you.  Get at least 30 minutes of exercise that causes your heart to beat faster (aerobic exercise) most days of the week. Activities may include walking, swimming, or biking.  Work with your health care provider or diet and nutrition specialist (dietitian) to adjust your eating plan to your individual calorie needs. Reading food labels  Check food labels for the amount of sodium per serving. Choose foods with less than 5 percent of the Daily Value of sodium. Generally, foods with less than 300 mg of sodium per serving fit into this eating plan.  To find whole grains, look for the word "whole" as the first word in the ingredient list. Shopping  Buy products labeled as "low-sodium" or "no salt added."  Buy fresh foods. Avoid canned foods and premade or frozen meals. Cooking  Avoid adding salt when cooking. Use salt-free seasonings or herbs instead of table salt or sea salt. Check with your health care provider or pharmacist before using salt substitutes.  Do not fry foods. Cook foods using healthy methods such as baking, boiling, grilling, and broiling instead.  Cook with  heart-healthy oils, such as olive, canola, soybean, or sunflower oil. Meal planning   Eat a balanced diet that includes: ? 5 or more servings of fruits and vegetables each day. At each meal, try to fill half of your plate with fruits and vegetables. ? Up to 6-8 servings of whole grains each day. ? Less than 6 oz of lean meat, poultry, or fish each day. A 3-oz serving of meat is about the same size as a deck of cards. One egg equals 1 oz. ? 2 servings of low-fat dairy each day. ? A serving of nuts, seeds, or beans 5 times each week. ? Heart-healthy fats. Healthy fats called Omega-3 fatty acids are found in foods such as flaxseeds and coldwater fish, like sardines, salmon, and mackerel.  Limit how much you eat of the following: ? Canned or prepackaged foods. ? Food that is high in trans fat, such as fried foods. ? Food that is high in saturated fat, such as fatty meat. ? Sweets, desserts, sugary drinks, and other foods with added sugar. ? Full-fat dairy products.  Do not salt foods before eating.  Try to eat at least 2 vegetarian meals each week.  Eat more home-cooked food and less restaurant, buffet, and fast food.  When eating at a restaurant, ask that your food be prepared with less salt or no salt, if possible. What foods are recommended? The items listed may not be a complete list. Talk with your dietitian about what   dietary choices are best for you. Grains Whole-grain or whole-wheat bread. Whole-grain or whole-wheat pasta. Brown rice. Oatmeal. Quinoa. Bulgur. Whole-grain and low-sodium cereals. Pita bread. Low-fat, low-sodium crackers. Whole-wheat flour tortillas. Vegetables Fresh or frozen vegetables (raw, steamed, roasted, or grilled). Low-sodium or reduced-sodium tomato and vegetable juice. Low-sodium or reduced-sodium tomato sauce and tomato paste. Low-sodium or reduced-sodium canned vegetables. Fruits All fresh, dried, or frozen fruit. Canned fruit in natural juice (without  added sugar). Meat and other protein foods Skinless chicken or turkey. Ground chicken or turkey. Pork with fat trimmed off. Fish and seafood. Egg whites. Dried beans, peas, or lentils. Unsalted nuts, nut butters, and seeds. Unsalted canned beans. Lean cuts of beef with fat trimmed off. Low-sodium, lean deli meat. Dairy Low-fat (1%) or fat-free (skim) milk. Fat-free, low-fat, or reduced-fat cheeses. Nonfat, low-sodium ricotta or cottage cheese. Low-fat or nonfat yogurt. Low-fat, low-sodium cheese. Fats and oils Soft margarine without trans fats. Vegetable oil. Low-fat, reduced-fat, or light mayonnaise and salad dressings (reduced-sodium). Canola, safflower, olive, soybean, and sunflower oils. Avocado. Seasoning and other foods Herbs. Spices. Seasoning mixes without salt. Unsalted popcorn and pretzels. Fat-free sweets. What foods are not recommended? The items listed may not be a complete list. Talk with your dietitian about what dietary choices are best for you. Grains Baked goods made with fat, such as croissants, muffins, or some breads. Dry pasta or rice meal packs. Vegetables Creamed or fried vegetables. Vegetables in a cheese sauce. Regular canned vegetables (not low-sodium or reduced-sodium). Regular canned tomato sauce and paste (not low-sodium or reduced-sodium). Regular tomato and vegetable juice (not low-sodium or reduced-sodium). Pickles. Olives. Fruits Canned fruit in a light or heavy syrup. Fried fruit. Fruit in cream or butter sauce. Meat and other protein foods Fatty cuts of meat. Ribs. Fried meat. Bacon. Sausage. Bologna and other processed lunch meats. Salami. Fatback. Hotdogs. Bratwurst. Salted nuts and seeds. Canned beans with added salt. Canned or smoked fish. Whole eggs or egg yolks. Chicken or turkey with skin. Dairy Whole or 2% milk, cream, and half-and-half. Whole or full-fat cream cheese. Whole-fat or sweetened yogurt. Full-fat cheese. Nondairy creamers. Whipped toppings.  Processed cheese and cheese spreads. Fats and oils Butter. Stick margarine. Lard. Shortening. Ghee. Bacon fat. Tropical oils, such as coconut, palm kernel, or palm oil. Seasoning and other foods Salted popcorn and pretzels. Onion salt, garlic salt, seasoned salt, table salt, and sea salt. Worcestershire sauce. Tartar sauce. Barbecue sauce. Teriyaki sauce. Soy sauce, including reduced-sodium. Steak sauce. Canned and packaged gravies. Fish sauce. Oyster sauce. Cocktail sauce. Horseradish that you find on the shelf. Ketchup. Mustard. Meat flavorings and tenderizers. Bouillon cubes. Hot sauce and Tabasco sauce. Premade or packaged marinades. Premade or packaged taco seasonings. Relishes. Regular salad dressings. Where to find more information:  National Heart, Lung, and Blood Institute: www.nhlbi.nih.gov  American Heart Association: www.heart.org Summary  The DASH eating plan is a healthy eating plan that has been shown to reduce high blood pressure (hypertension). It may also reduce your risk for type 2 diabetes, heart disease, and stroke.  With the DASH eating plan, you should limit salt (sodium) intake to 2,300 mg a day. If you have hypertension, you may need to reduce your sodium intake to 1,500 mg a day.  When on the DASH eating plan, aim to eat more fresh fruits and vegetables, whole grains, lean proteins, low-fat dairy, and heart-healthy fats.  Work with your health care provider or diet and nutrition specialist (dietitian) to adjust your eating plan to your individual   calorie needs. This information is not intended to replace advice given to you by your health care provider. Make sure you discuss any questions you have with your health care provider. Document Released: 02/01/2011 Document Revised: 02/06/2016 Document Reviewed: 02/06/2016 Elsevier Interactive Patient Education  2017 Elsevier Inc.  

## 2016-09-11 NOTE — Assessment & Plan Note (Signed)
Stable, continue present medications.   

## 2016-09-11 NOTE — Assessment & Plan Note (Signed)
This is not to goal.  Pt will work on diet and walk more

## 2016-09-11 NOTE — Assessment & Plan Note (Signed)
Not wanting to use a steroid cream

## 2016-09-11 NOTE — Progress Notes (Signed)
BP (!) 140/96 (BP Location: Left Arm, Cuff Size: Normal)   Pulse 64   Temp 99 F (37.2 C)   Ht 5' 5.2" (1.656 m)   Wt 179 lb 11.2 oz (81.5 kg)   LMP 01/16/2010 (Approximate)   SpO2 98%   BMI 29.72 kg/m    Subjective:    Patient ID: Jo Martinez, female    DOB: 1960/07/12, 56 y.o.   MRN: [id number]  HPI: Jo Martinez is a 56 y.o. female  Chief Complaint  Patient presents with  . Annual Exam   Pt had recent surgery for diverticulitis following 4 episodes in 1 year.  She did will with the surgery  Depression Pt is doing well on current medication [facility] 09/11/2016 08/15/2015 12/07/2014  Decreased Interest 0 0 0  Down, Depressed, Hopeless 0 0 0  PHQ - 2 Score 0 0 0  Altered sleeping 0 - -  Tired, decreased energy 0 - -  Change in appetite 0 - -  Feeling bad or failure about yourself  0 - -  Trouble concentrating 0 - -  Moving slowly or fidgety/restless 0 - -  Suicidal thoughts 0 - -  PHQ-9 Score 0 - -   Hypertension Using medications without difficulty Average home BPs : 145-150 70s No problems or lightheadedness No chest pain with exertion or shortness of breath No Edema   Social History   Social History  . Marital status: Married    Spouse name: N/A  . Number of children: N/A  . Years of education: N/A   Occupational History  . Not on file.   Social History Main Topics  . Smoking status: Never Smoker  . Smokeless tobacco: Never Used  . Alcohol use No  . Drug use: No  . Sexual activity: Yes   Other Topics Concern  . Not on file   Social History Narrative  . No narrative on file   Family History  Problem Relation Age of Onset  . Lymphoma Mother   . Osteoporosis Mother   . Cancer Mother        lymphoma  . CAD Father   . Dementia Father   . Heart disease Father   . Hyperlipidemia Father   . Hypertension Father   . Arthritis Sister   . Arthritis Brother   . Hyperlipidemia Brother    Past Medical History:  Diagnosis  Date  . Anxiety   . Dental crown present    Implant - top front  . Depression   . Diverticulitis   . Genital herpes   . Hyperlipidemia   . Hypertension   . Sleep apnea    stopped using CPAP after wt loss(8/16)  . Wears contact lenses    Past Surgical History:  Procedure Laterality Date  . COLON SURGERY  11/03/2015   partial colon removal   . COLONOSCOPY N/A 07/06/2015   Procedure: COLONOSCOPY;  Surgeon: Wallace CullensPaul Y Oh, MD;  [facility];  Service: Gastroenterology;  Laterality: N/A;  SLEEP APNEA - no CPAP  . DRAINAGE TUBE REMOVAL ([facility] HX)  10/18/14   [facility]  . TONSILLECTOMY      Relevant past medical, surgical, family and social history reviewed and updated as indicated. Interim medical history since our last visit reviewed. Allergies and medications reviewed and updated.  Review of Systems  Per HPI unless specifically indicated above     Objective:    BP (!) 140/96 (BP Location: Left Arm, Cuff Size:  Normal)   Pulse 64   Temp 99 F (37.2 C)   Ht 5' 5.2" (1.656 m)   Wt 179 lb 11.2 oz (81.5 kg)   LMP 01/16/2010 (Approximate)   SpO2 98%   BMI 29.72 kg/m   Wt Readings from Last 3 Encounters:  09/11/16 179 lb 11.2 oz (81.5 kg)  08/15/15 176 lb (79.8 kg)  07/06/15 173 lb (78.5 kg)    Physical Exam  Constitutional: She is oriented to person, place, and time. She appears well-developed and well-nourished.  HENT:  Head: Normocephalic and atraumatic.  Eyes: Pupils are equal, round, and reactive to light. Right eye exhibits no discharge. Left eye exhibits no discharge. No scleral icterus.  Neck: Normal range of motion. Neck supple. Carotid bruit is not present. No thyromegaly present.  Cardiovascular: Normal rate, regular rhythm and normal heart sounds.  Exam reveals no gallop and no friction rub.   No murmur heard. Pulmonary/Chest: Effort normal and breath sounds normal. No respiratory distress. She has no wheezes. She has no rales.  Abdominal: Soft. Bowel  sounds are normal. There is no tenderness. There is no rebound.  Genitourinary: No breast swelling, tenderness or discharge.  Musculoskeletal: Normal range of motion.  Lymphadenopathy:    She has no cervical adenopathy.  Neurological: She is alert and oriented to person, place, and time.  Skin: Skin is warm, dry and intact. No rash noted.  Psychiatric: She has a normal mood and affect. Her speech is normal and behavior is normal. Judgment and thought content normal. Cognition and memory are normal.     Assessment & Plan:   Problem List Items Addressed This Visit      Unprioritized   Eczema    Not wanting to use a steroid cream      Genital herpes    Stable on Valtrex      Relevant Medications   valACYclovir (VALTREX) 500 MG tablet   Hypertension    This is not to goal.  Pt will work on diet and walk more      Relevant Medications   losartan-hydrochlorothiazide (HYZAAR) 100-25 MG tablet   Other Relevant Orders   Comprehensive metabolic panel   Lipid Panel w/o Chol/HDL Ratio   TSH   Major depressive disorder, single episode, in remission (HCC)    Stable, continue present medications.        Relevant Medications   FLUoxetine (PROZAC) 20 MG capsule    Other Visit Diagnoses    Annual physical exam    -  Primary   Relevant Orders   CBC with Differential/Platelet   MM DIGITAL SCREENING BILATERAL      Recheck in 3 months for BP   Refusing pap smear.  Will catch up next year.    Follow up plan: Return in about 3 months (around 12/12/2016).

## 2016-09-11 NOTE — Assessment & Plan Note (Signed)
Stable on Valtrex.

## 2016-09-12 LAB — CBC WITH DIFFERENTIAL/PLATELET
BASOS: 1 %
Basophils Absolute: 0 10*3/uL (ref 0.0–0.2)
EOS (ABSOLUTE): 0.1 10*3/uL (ref 0.0–0.4)
EOS: 1 %
HEMATOCRIT: 46.5 % (ref 34.0–46.6)
Hemoglobin: 15.6 g/dL (ref 11.1–15.9)
IMMATURE GRANULOCYTES: 0 %
Immature Grans (Abs): 0 10*3/uL (ref 0.0–0.1)
Lymphocytes Absolute: 3.1 10*3/uL (ref 0.7–3.1)
Lymphs: 39 %
MCH: 35.5 pg — ABNORMAL HIGH (ref 26.6–33.0)
MCHC: 33.5 g/dL (ref 31.5–35.7)
MCV: 106 fL — ABNORMAL HIGH (ref 79–97)
MONOS ABS: 0.6 10*3/uL (ref 0.1–0.9)
Monocytes: 8 %
NEUTROS ABS: 4 10*3/uL (ref 1.4–7.0)
NEUTROS PCT: 51 %
PLATELETS: 215 10*3/uL (ref 150–379)
RBC: 4.4 x10E6/uL (ref 3.77–5.28)
RDW: 14.1 % (ref 12.3–15.4)
WBC: 7.9 10*3/uL (ref 3.4–10.8)

## 2016-09-12 LAB — LIPID PANEL W/O CHOL/HDL RATIO
Cholesterol, Total: 248 mg/dL — ABNORMAL HIGH (ref 100–199)
HDL: 52 mg/dL (ref >39)
LDL Calculated: 154 mg/dL — ABNORMAL HIGH (ref 0–99)
TRIGLYCERIDES: 208 mg/dL — AB (ref 0–149)
VLDL Cholesterol Cal: 42 mg/dL — ABNORMAL HIGH (ref 5–40)

## 2016-09-12 LAB — COMPREHENSIVE METABOLIC PANEL
A/G RATIO: 1.5 (ref 1.2–2.2)
ALT: 41 IU/L — ABNORMAL HIGH (ref 0–32)
AST: 49 IU/L — AB (ref 0–40)
Albumin: 4 g/dL (ref 3.5–5.5)
Alkaline Phosphatase: 62 IU/L (ref 39–117)
BILIRUBIN TOTAL: 0.5 mg/dL (ref 0.0–1.2)
BUN/Creatinine Ratio: 11 (ref 9–23)
BUN: 11 mg/dL (ref 6–24)
CHLORIDE: 97 mmol/L (ref 96–106)
CO2: 25 mmol/L (ref 20–29)
Calcium: 9.7 mg/dL (ref 8.7–10.2)
Creatinine, Ser: 0.99 mg/dL (ref 0.57–1.00)
GFR, EST AFRICAN AMERICAN: 75 mL/min/{1.73_m2} (ref >59)
GFR, EST NON AFRICAN AMERICAN: 65 mL/min/{1.73_m2} (ref >59)
GLOBULIN, TOTAL: 2.7 g/dL (ref 1.5–4.5)
Glucose: 107 mg/dL — ABNORMAL HIGH (ref 65–99)
POTASSIUM: 3.8 mmol/L (ref 3.5–5.2)
SODIUM: 139 mmol/L (ref 134–144)
Total Protein: 6.7 g/dL (ref 6.0–8.5)

## 2016-09-12 LAB — TSH: TSH: 3.8 u[IU]/mL (ref 0.450–4.500)

## 2016-09-14 ENCOUNTER — Telehealth: Payer: Self-pay | Admitting: Unknown Physician Specialty

## 2016-09-14 DIAGNOSIS — R748 Abnormal levels of other serum enzymes: Secondary | ICD-10-CM

## 2016-09-14 DIAGNOSIS — D7589 Other specified diseases of blood and blood-forming organs: Secondary | ICD-10-CM | POA: Insufficient documentation

## 2016-09-14 NOTE — Progress Notes (Signed)
Normal labs.  Pt notified through mychart

## 2016-09-14 NOTE — Telephone Encounter (Signed)
Discussed with pt high cholesterol, elevated liver enzymes and macrocytosis.  Recheck in 6 weeks lipid panel, B12, and repeat CMP

## 2016-09-27 ENCOUNTER — Other Ambulatory Visit: Payer: Self-pay | Admitting: Unknown Physician Specialty

## 2016-12-14 ENCOUNTER — Other Ambulatory Visit: Payer: Self-pay | Admitting: Unknown Physician Specialty

## 2016-12-14 MED ORDER — LOSARTAN POTASSIUM-HCTZ 100-25 MG PO TABS: 1.0000 | ORAL_TABLET | Freq: Every day | ORAL | 1 refills | Status: DC

## 2016-12-14 NOTE — Telephone Encounter (Signed)
Patient ran out of medication for losartan 12/13/2016. [facility] [address] .  Patient uses mychart and states she can be contacted through [facility] as well. Patient would like to be made aware if she can get a refill or will need to come in sooner. Next scheduled visit is 12/18/2016.   Thank you

## 2016-12-14 NOTE — Telephone Encounter (Signed)
Routing to provider.   Medication Refill.

## 2016-12-18 ENCOUNTER — Ambulatory Visit (INDEPENDENT_AMBULATORY_CARE_PROVIDER_SITE_OTHER): Payer: BC Managed Care – PPO | Admitting: Unknown Physician Specialty

## 2016-12-18 ENCOUNTER — Encounter: Payer: Self-pay | Admitting: Unknown Physician Specialty

## 2016-12-18 DIAGNOSIS — E785 Hyperlipidemia, unspecified: Secondary | ICD-10-CM | POA: Diagnosis not present

## 2016-12-18 DIAGNOSIS — R748 Abnormal levels of other serum enzymes: Secondary | ICD-10-CM

## 2016-12-18 DIAGNOSIS — I10 Essential (primary) hypertension: Secondary | ICD-10-CM | POA: Diagnosis not present

## 2016-12-18 NOTE — Assessment & Plan Note (Signed)
Stable, continue present medications.   

## 2016-12-18 NOTE — Progress Notes (Signed)
BP 110/76   Pulse 78   Temp 98.1 F (36.7 C)   Wt 180 lb 9.6 oz (81.9 kg)   LMP 01/16/2010 (Approximate)   SpO2 98%   BMI 29.87 kg/m    Subjective:    Patient ID: Jo Martinez, female    DOB: May 14, 1960, 56 y.o.   MRN: [id number]  HPI: ANALYAH Martinez is a 55 y.o. female  Chief Complaint  Patient presents with  . Hypertension    3 month f/up    Hypertension Using medications without difficulty Average home BPs   No problems or lightheadedness No chest pain with exertion or shortness of breath No Edema  The 10-year ASCVD risk score Denman George DC Jr., et al., 2013) is: 2.4%   Values used to calculate the score:     Age: 12 years     Sex: Female     Is Non-Hispanic African American: No     Diabetic: No     Tobacco smoker: No     Systolic Blood Pressure: 110 mmHg     Is BP treated: Yes     HDL Cholesterol: 52 mg/dL     Total Cholesterol: 248 mg/dL   Elevated liver enzymes Noted last visit.  She wonders if it is related to the Flagyl.  Normal when she went to the ER last month for diverticulitis  Relevant past medical, surgical, family and social history reviewed and updated as indicated. Interim medical history since our last visit reviewed. Allergies and medications reviewed and updated.  Review of Systems  Per HPI unless specifically indicated above     Objective:    BP 110/76   Pulse 78   Temp 98.1 F (36.7 C)   Wt 180 lb 9.6 oz (81.9 kg)   LMP 01/16/2010 (Approximate)   SpO2 98%   BMI 29.87 kg/m   Wt Readings from Last 3 Encounters:  12/18/16 180 lb 9.6 oz (81.9 kg)  09/11/16 179 lb 11.2 oz (81.5 kg)  08/15/15 176 lb (79.8 kg)    Physical Exam  Constitutional: She is oriented to person, place, and time. She appears well-developed and well-nourished. No distress.  HENT:  Head: Normocephalic and atraumatic.  Eyes: Conjunctivae and lids are normal. Right eye exhibits no discharge. Left eye exhibits no discharge. No scleral icterus.  Neck: Normal  range of motion. Neck supple. No JVD present. Carotid bruit is not present.  Cardiovascular: Normal rate, regular rhythm and normal heart sounds.   Pulmonary/Chest: Effort normal and breath sounds normal.  Abdominal: Normal appearance. There is no splenomegaly or hepatomegaly.  Musculoskeletal: Normal range of motion.  Neurological: She is alert and oriented to person, place, and time.  Skin: Skin is warm, dry and intact. No rash noted. No pallor.  Psychiatric: She has a normal mood and affect. Her behavior is normal. Judgment and thought content normal.    Results for orders placed or performed in visit on 09/11/16  CBC with Differential/Platelet  Result Value Ref Range   WBC 7.9 3.4 - 10.8 x10E3/uL   RBC 4.40 3.77 - 5.28 x10E6/uL   Hemoglobin 15.6 11.1 - 15.9 g/dL   Hematocrit 40.9 81.1 - 46.6 %   MCV 106 (H) 79 - 97 fL   MCH 35.5 (H) 26.6 - 33.0 pg   MCHC 33.5 31.5 - 35.7 g/dL   RDW 91.4 78.2 - 95.6 %   Platelets 215 150 - 379 x10E3/uL   Neutrophils 51 Not Estab. %   Lymphs 39 Not  Estab. %   Monocytes 8 Not Estab. %   Eos 1 Not Estab. %   Basos 1 Not Estab. %   Neutrophils Absolute 4.0 1.4 - 7.0 x10E3/uL   Lymphocytes Absolute 3.1 0.7 - 3.1 x10E3/uL   Monocytes Absolute 0.6 0.1 - 0.9 x10E3/uL   EOS (ABSOLUTE) 0.1 0.0 - 0.4 x10E3/uL   Basophils Absolute 0.0 0.0 - 0.2 x10E3/uL   Immature Granulocytes 0 Not Estab. %   Immature Grans (Abs) 0.0 0.0 - 0.1 x10E3/uL  Comprehensive metabolic panel  Result Value Ref Range   Glucose 107 (H) 65 - 99 mg/dL   BUN 11 6 - 24 mg/dL   Creatinine, Ser 3.240.99 0.57 - 1.00 mg/dL   GFR calc non Af Amer 65 >59 mL/min/1.73   GFR calc Af Amer 75 >59 mL/min/1.73   BUN/Creatinine Ratio 11 9 - 23   Sodium 139 134 - 144 mmol/L   Potassium 3.8 3.5 - 5.2 mmol/L   Chloride 97 96 - 106 mmol/L   CO2 25 20 - 29 mmol/L   Calcium 9.7 8.7 - 10.2 mg/dL   Total Protein 6.7 6.0 - 8.5 g/dL   Albumin 4.0 3.5 - 5.5 g/dL   Globulin, Total 2.7 1.5 - 4.5 g/dL    Albumin/Globulin Ratio 1.5 1.2 - 2.2   Bilirubin Total 0.5 0.0 - 1.2 mg/dL   Alkaline Phosphatase 62 39 - 117 IU/L   AST 49 (H) 0 - 40 IU/L   ALT 41 (H) 0 - 32 IU/L  Lipid Panel w/o Chol/HDL Ratio  Result Value Ref Range   Cholesterol, Total 248 (H) 100 - 199 mg/dL   Triglycerides 401208 (H) 0 - 149 mg/dL   HDL 52 >02>39 mg/dL   VLDL Cholesterol Cal 42 (H) 5 - 40 mg/dL   LDL Calculated 725154 (H) 0 - 99 mg/dL  TSH  Result Value Ref Range   TSH 3.800 0.450 - 4.500 uIU/mL      Assessment & Plan:   Problem List Items Addressed This Visit      Unprioritized   RESOLVED: Elevated liver enzymes    Noted normal levels last month      Hyperlipidemia    Elevated but not in statin benefit group      Hypertension    Stable, continue present medications.            Follow up plan: Return in about 6 months (around 06/18/2017).

## 2016-12-18 NOTE — Assessment & Plan Note (Signed)
Elevated but not in statin benefit group

## 2016-12-18 NOTE — Assessment & Plan Note (Signed)
Noted normal levels last month

## 2017-01-09 ENCOUNTER — Encounter: Payer: Self-pay | Admitting: Unknown Physician Specialty

## 2017-03-29 ENCOUNTER — Ambulatory Visit: Payer: BC Managed Care – PPO | Admitting: Family Medicine

## 2017-03-29 ENCOUNTER — Encounter: Payer: Self-pay | Admitting: Family Medicine

## 2017-03-29 VITALS — BP 115/78 | HR 89 | Temp 98.2°F | Wt 179.4 lb

## 2017-03-29 DIAGNOSIS — H6591 Unspecified nonsuppurative otitis media, right ear: Secondary | ICD-10-CM

## 2017-03-29 NOTE — Patient Instructions (Signed)
Take claritin daily Flonase twice daily (up nostrils then point out toward corner of eyes) Sudafed every 6 hours for 3-4 days

## 2017-03-29 NOTE — Progress Notes (Signed)
   BP 115/78 (BP Location: Left Arm, Patient Position: Sitting, Cuff Size: Normal)   Pulse 89   Temp 98.2 F (36.8 C) (Oral)   Wt 179 lb 6.4 oz (81.4 kg)   LMP 01/16/2010 (Approximate)   SpO2 99%   BMI 29.67 kg/m    Subjective:    Patient ID: Jo Martinez, female    DOB: 04-08-1960, 57 y.o.   MRN: [id number]  HPI: Jo Martinez is a 57 y.o. female  Chief Complaint  Patient presents with  . Ear Pain    Right ear, ongoing for 3 weeks. Patient states it's not constant, but will get a sharp pain in ear.   Patient here with 3 weeks of intermittent quick episodes of sharp right ear discomfort. Pain lasts a second at a time. No muffled hearing, fevers, rhinorrhea, ear drainage, headaches. Has not been trying anything for symptoms.   Relevant past medical, surgical, family and social history reviewed and updated as indicated. Interim medical history since our last visit reviewed. Allergies and medications reviewed and updated.  Review of Systems  Constitutional: Negative.   HENT: Positive for ear pain.   Respiratory: Negative.   Cardiovascular: Negative.   Gastrointestinal: Negative.   Genitourinary: Negative.   Musculoskeletal: Negative.   Neurological: Negative.   Psychiatric/Behavioral: Negative.     Per HPI unless specifically indicated above     Objective:    BP 115/78 (BP Location: Left Arm, Patient Position: Sitting, Cuff Size: Normal)   Pulse 89   Temp 98.2 F (36.8 C) (Oral)   Wt 179 lb 6.4 oz (81.4 kg)   LMP 01/16/2010 (Approximate)   SpO2 99%   BMI 29.67 kg/m   Wt Readings from Last 3 Encounters:  03/29/17 179 lb 6.4 oz (81.4 kg)  12/18/16 180 lb 9.6 oz (81.9 kg)  09/11/16 179 lb 11.2 oz (81.5 kg)    Physical Exam  Constitutional: She is oriented to person, place, and time. She appears well-developed and well-nourished. No distress.  HENT:  Head: Atraumatic.  Right Ear: External ear normal.  Left Ear: External ear normal.  Nose: Nose normal.    Mouth/Throat: Oropharynx is clear and moist. No oropharyngeal exudate.  Right middle ear effusion with mild TM injection   Cardiovascular: Normal rate and normal heart sounds.  Pulmonary/Chest: Effort normal and breath sounds normal. No respiratory distress.  Musculoskeletal: Normal range of motion.  Neurological: She is alert and oriented to person, place, and time.  Skin: Skin is warm and dry.  Psychiatric: She has a normal mood and affect. Her behavior is normal.  Nursing note and vitals reviewed.     Assessment & Plan:   Problem List Items Addressed This Visit    None    Visit Diagnoses    Right otitis media with effusion    -  Primary   Will start OTC flonase and several days of sudafed. Cautioned about elevating BPs while on sudafed. Continue daily claritin.        Follow up plan: Return if symptoms worsen or fail to improve.

## 2017-06-09 ENCOUNTER — Other Ambulatory Visit: Payer: Self-pay | Admitting: Family Medicine

## 2017-06-09 NOTE — Telephone Encounter (Signed)
Your patient 

## 2017-06-12 ENCOUNTER — Ambulatory Visit: Payer: BC Managed Care – PPO | Admitting: Unknown Physician Specialty

## 2017-06-12 ENCOUNTER — Encounter: Payer: Self-pay | Admitting: Unknown Physician Specialty

## 2017-06-12 VITALS — BP 108/70 | HR 92 | Temp 98.3°F | Ht 65.0 in | Wt 174.0 lb

## 2017-06-12 DIAGNOSIS — I10 Essential (primary) hypertension: Secondary | ICD-10-CM

## 2017-06-12 DIAGNOSIS — R748 Abnormal levels of other serum enzymes: Secondary | ICD-10-CM

## 2017-06-12 DIAGNOSIS — F325 Major depressive disorder, single episode, in full remission: Secondary | ICD-10-CM | POA: Diagnosis not present

## 2017-06-12 DIAGNOSIS — D7589 Other specified diseases of blood and blood-forming organs: Secondary | ICD-10-CM | POA: Diagnosis not present

## 2017-06-12 MED ORDER — LOSARTAN POTASSIUM-HCTZ 100-25 MG PO TABS: 1.0000 | ORAL_TABLET | Freq: Every day | ORAL | 1 refills | Status: DC

## 2017-06-12 NOTE — Assessment & Plan Note (Addendum)
BP 108/70 on recheck.  Continue present medications

## 2017-06-12 NOTE — Assessment & Plan Note (Signed)
Chronic problem.  Low B12 noted 2 years ago.  Not supplementing

## 2017-06-12 NOTE — Progress Notes (Signed)
BP 108/70   Pulse 92   Temp 98.3 F (36.8 C) (Oral)   Ht 5\' 5"  (1.651 m)   Wt 174 lb (78.9 kg)   LMP 01/16/2010 (Approximate)   SpO2 98%   BMI 28.96 kg/m    Subjective:    Patient ID: Jo Martinez, female    DOB: [birth year], 57 y.o.   MRN: [id number]  HPI: Jo Martinez is a 57 y.o. female  Chief Complaint  Patient presents with  . Depression  . Hypertension   Hypertension Using medications without difficulty Average home BPs   No problems or lightheadedness No chest pain with exertion or shortness of breath No Edema  Elevated liver enzymes Noted last visit.  Pt feels it is due to antibiotics she took for diverticulitis  The 10-year ASCVD risk score Denman George DC Montez Hageman., et al., 2013) is: 2.5%   Values used to calculate the score:     Age: 38 years     Sex: Female     Is Non-Hispanic African American: No     Diabetic: No     Tobacco smoker: No     Systolic Blood Pressure: 108 mmHg     Is BP treated: Yes     HDL Cholesterol: 52 mg/dL     Total Cholesterol: 248 mg/dL  Depression States mood is doing well Depression screen [facility] 2/9 06/12/2017 12/18/2016 09/11/2016 08/15/2015 12/07/2014  Decreased Interest 0 0 0 0 0  Down, Depressed, Hopeless 0 0 0 0 0  PHQ - 2 Score 0 0 0 0 0  Altered sleeping 0 0 0 - -  Tired, decreased energy 0 0 0 - -  Change in appetite 0 0 0 - -  Feeling bad or failure about yourself  0 0 0 - -  Trouble concentrating 0 0 0 - -  Moving slowly or fidgety/restless 0 0 0 - -  Suicidal thoughts 0 0 0 - -  PHQ-9 Score 0 0 0 - -   Social History   Socioeconomic History  . Marital status: Married    Spouse name: Not on file  . Number of children: Not on file  . Years of education: Not on file  . Highest education level: Not on file  Occupational History  . Not on file  Social Needs  . Financial resource strain: Not on file  . Food insecurity:    Worry: Not on file    Inability: Not on file  . Transportation needs:    Medical: Not on file     Non-medical: Not on file  Tobacco Use  . Smoking status: Never Smoker  . Smokeless tobacco: Never Used  Substance and Sexual Activity  . Alcohol use: No    Alcohol/week: 0.0 oz  . Drug use: No  . Sexual activity: Yes  Lifestyle  . Physical activity:    Days per week: Not on file    Minutes per session: Not on file  . Stress: Not on file  Relationships  . Social connections:    Talks on phone: Not on file    Gets together: Not on file    Attends religious service: Not on file    Active member of club or organization: Not on file    Attends meetings of clubs or organizations: Not on file    Relationship status: Not on file  . Intimate partner violence:    Fear of current or ex partner: Not on file    Emotionally abused:  Not on file    Physically abused: Not on file    Forced sexual activity: Not on file  Other Topics Concern  . Not on file  Social History Narrative  . Not on file    Relevant past medical, surgical, family and social history reviewed and updated as indicated. Interim medical history since our last visit reviewed. Allergies and medications reviewed and updated.  Review of Systems  Per HPI unless specifically indicated above     Objective:    BP 108/70   Pulse 92   Temp 98.3 F (36.8 C) (Oral)   Ht 5\' 5"  (1.651 m)   Wt 174 lb (78.9 kg)   LMP 01/16/2010 (Approximate)   SpO2 98%   BMI 28.96 kg/m   Wt Readings from Last 3 Encounters:  06/12/17 174 lb (78.9 kg)  03/29/17 179 lb 6.4 oz (81.4 kg)  12/18/16 180 lb 9.6 oz (81.9 kg)    Physical Exam  Constitutional: She is oriented to person, place, and time. She appears well-developed and well-nourished.  HENT:  Head: Normocephalic and atraumatic.  Eyes: Pupils are equal, round, and reactive to light. Right eye exhibits no discharge. Left eye exhibits no discharge. No scleral icterus.  Neck: Normal range of motion. Neck supple. Carotid bruit is not present. No thyromegaly present.    Cardiovascular: Normal rate, regular rhythm and normal heart sounds. Exam reveals no gallop and no friction rub.  No murmur heard. Pulmonary/Chest: Effort normal and breath sounds normal. No respiratory distress. She has no wheezes. She has no rales. No breast tenderness or discharge.  Abdominal: Soft. Bowel sounds are normal. There is no tenderness. There is no rebound.  Genitourinary: No breast tenderness or discharge.  Musculoskeletal: Normal range of motion.  Lymphadenopathy:    She has no cervical adenopathy.  Neurological: She is alert and oriented to person, place, and time.  Skin: Skin is warm, dry and intact. No rash noted.  Psychiatric: She has a normal mood and affect. Her speech is normal and behavior is normal. Judgment and thought content normal. Cognition and memory are normal.    Results for orders placed or performed in visit on 09/11/16  CBC with Differential/Platelet  Result Value Ref Range   WBC 7.9 3.4 - 10.8 x10E3/uL   RBC 4.40 3.77 - 5.28 x10E6/uL   Hemoglobin 15.6 11.1 - 15.9 g/dL   Hematocrit 16.1 09.6 - 46.6 %   MCV 106 (H) 79 - 97 fL   MCH 35.5 (H) 26.6 - 33.0 pg   MCHC 33.5 31.5 - 35.7 g/dL   RDW 04.5 40.9 - 81.1 %   Platelets 215 150 - 379 x10E3/uL   Neutrophils 51 Not Estab. %   Lymphs 39 Not Estab. %   Monocytes 8 Not Estab. %   Eos 1 Not Estab. %   Basos 1 Not Estab. %   Neutrophils Absolute 4.0 1.4 - 7.0 x10E3/uL   Lymphocytes Absolute 3.1 0.7 - 3.1 x10E3/uL   Monocytes Absolute 0.6 0.1 - 0.9 x10E3/uL   EOS (ABSOLUTE) 0.1 0.0 - 0.4 x10E3/uL   Basophils Absolute 0.0 0.0 - 0.2 x10E3/uL   Immature Granulocytes 0 Not Estab. %   Immature Grans (Abs) 0.0 0.0 - 0.1 x10E3/uL  Comprehensive metabolic panel  Result Value Ref Range   Glucose 107 (H) 65 - 99 mg/dL   BUN 11 6 - 24 mg/dL   Creatinine, Ser 9.14 0.57 - 1.00 mg/dL   GFR calc non Af Amer 65 >59 mL/min/1.73  GFR calc Af Amer 75 >59 mL/min/1.73   BUN/Creatinine Ratio 11 9 - 23   Sodium 139  134 - 144 mmol/L   Potassium 3.8 3.5 - 5.2 mmol/L   Chloride 97 96 - 106 mmol/L   CO2 25 20 - 29 mmol/L   Calcium 9.7 8.7 - 10.2 mg/dL   Total Protein 6.7 6.0 - 8.5 g/dL   Albumin 4.0 3.5 - 5.5 g/dL   Globulin, Total 2.7 1.5 - 4.5 g/dL   Albumin/Globulin Ratio 1.5 1.2 - 2.2   Bilirubin Total 0.5 0.0 - 1.2 mg/dL   Alkaline Phosphatase 62 39 - 117 IU/L   AST 49 (H) 0 - 40 IU/L   ALT 41 (H) 0 - 32 IU/L  Lipid Panel w/o Chol/HDL Ratio  Result Value Ref Range   Cholesterol, Total 248 (H) 100 - 199 mg/dL   Triglycerides 161208 (H) 0 - 149 mg/dL   HDL 52 >09>39 mg/dL   VLDL Cholesterol Cal 42 (H) 5 - 40 mg/dL   LDL Calculated 604154 (H) 0 - 99 mg/dL  TSH  Result Value Ref Range   TSH 3.800 0.450 - 4.500 uIU/mL      Assessment & Plan:   Problem List Items Addressed This Visit      Unprioritized   Hypertension    BP 108/70 on recheck.  Continue present medications      Relevant Medications   losartan-hydrochlorothiazide (HYZAAR) 100-25 MG tablet   Macrocytosis without anemia - Primary    Chronic problem.  Low B12 noted 2 years ago.  Not supplementing      Relevant Orders   CBC with Differential/Platelet   Vitamin B12   Major depressive disorder, single episode, in remission (HCC)    Stable, continue present medications.         Other Visit Diagnoses    Elevated liver enzymes       Elevated last visit.  Recheck today.     Relevant Orders   Comprehensive metabolic panel       Follow up plan: Return in about 6 months (around 12/12/2017) for physical.

## 2017-06-12 NOTE — Assessment & Plan Note (Signed)
Stable, continue present medications.   

## 2017-06-13 LAB — COMPREHENSIVE METABOLIC PANEL WITH GFR
ALT: 35 IU/L — ABNORMAL HIGH (ref 0–32)
AST: 46 IU/L — ABNORMAL HIGH (ref 0–40)
Albumin/Globulin Ratio: 1.8 (ref 1.2–2.2)
Albumin: 4.6 g/dL (ref 3.5–5.5)
Alkaline Phosphatase: 63 IU/L (ref 39–117)
BUN/Creatinine Ratio: 15 (ref 9–23)
BUN: 16 mg/dL (ref 6–24)
Bilirubin Total: 0.7 mg/dL (ref 0.0–1.2)
CO2: 23 mmol/L (ref 20–29)
Calcium: 9.7 mg/dL (ref 8.7–10.2)
Chloride: 100 mmol/L (ref 96–106)
Creatinine, Ser: 1.1 mg/dL — ABNORMAL HIGH (ref 0.57–1.00)
GFR calc Af Amer: 65 mL/min/1.73 (ref >59)
GFR calc non Af Amer: 57 mL/min/1.73 — ABNORMAL LOW (ref >59)
Globulin, Total: 2.5 g/dL (ref 1.5–4.5)
Glucose: 111 mg/dL — ABNORMAL HIGH (ref 65–99)
Potassium: 3.8 mmol/L (ref 3.5–5.2)
Sodium: 141 mmol/L (ref 134–144)
Total Protein: 7.1 g/dL (ref 6.0–8.5)

## 2017-06-13 LAB — CBC WITH DIFFERENTIAL/PLATELET
BASOS ABS: 0 10*3/uL (ref 0.0–0.2)
BASOS: 0 %
EOS (ABSOLUTE): 0.1 10*3/uL (ref 0.0–0.4)
Eos: 2 %
HEMOGLOBIN: 16.4 g/dL — AB (ref 11.1–15.9)
Hematocrit: 46.9 % — ABNORMAL HIGH (ref 34.0–46.6)
IMMATURE GRANS (ABS): 0 10*3/uL (ref 0.0–0.1)
IMMATURE GRANULOCYTES: 0 %
LYMPHS: 34 %
Lymphocytes Absolute: 3 10*3/uL (ref 0.7–3.1)
MCH: 38.3 pg — ABNORMAL HIGH (ref 26.6–33.0)
MCHC: 35 g/dL (ref 31.5–35.7)
MCV: 110 fL — AB (ref 79–97)
MONOCYTES: 6 %
Monocytes Absolute: 0.5 10*3/uL (ref 0.1–0.9)
NEUTROS PCT: 58 %
Neutrophils Absolute: 5.1 10*3/uL (ref 1.4–7.0)
PLATELETS: 248 10*3/uL (ref 150–379)
RBC: 4.28 x10E6/uL (ref 3.77–5.28)
RDW: 14.2 % (ref 12.3–15.4)
WBC: 8.8 10*3/uL (ref 3.4–10.8)

## 2017-06-13 LAB — VITAMIN B12: Vitamin B-12: 210 pg/mL — ABNORMAL LOW (ref 232–1245)

## 2017-06-13 NOTE — Progress Notes (Signed)
Normal labs.  Pt notified through mychart

## 2017-09-25 ENCOUNTER — Encounter: Payer: Self-pay | Admitting: Unknown Physician Specialty

## 2017-09-25 ENCOUNTER — Ambulatory Visit (INDEPENDENT_AMBULATORY_CARE_PROVIDER_SITE_OTHER): Payer: BC Managed Care – PPO | Admitting: Unknown Physician Specialty

## 2017-09-25 VITALS — BP 139/93 | HR 73 | Temp 99.0°F | Ht 65.5 in | Wt 171.8 lb

## 2017-09-25 DIAGNOSIS — E538 Deficiency of other specified B group vitamins: Secondary | ICD-10-CM

## 2017-09-25 DIAGNOSIS — Z0000 Encounter for general adult medical examination without abnormal findings: Secondary | ICD-10-CM | POA: Diagnosis not present

## 2017-09-25 DIAGNOSIS — L308 Other specified dermatitis: Secondary | ICD-10-CM

## 2017-09-25 DIAGNOSIS — A6000 Herpesviral infection of urogenital system, unspecified: Secondary | ICD-10-CM | POA: Diagnosis not present

## 2017-09-25 DIAGNOSIS — F325 Major depressive disorder, single episode, in full remission: Secondary | ICD-10-CM | POA: Diagnosis not present

## 2017-09-25 DIAGNOSIS — I10 Essential (primary) hypertension: Secondary | ICD-10-CM

## 2017-09-25 MED ORDER — FLUOXETINE HCL 20 MG PO CAPS: 20.0000 mg | ORAL_CAPSULE | Freq: Every day | ORAL | 3 refills | Status: DC

## 2017-09-25 MED ORDER — LOSARTAN POTASSIUM-HCTZ 100-25 MG PO TABS: 1.0000 | ORAL_TABLET | Freq: Every day | ORAL | 1 refills | Status: DC

## 2017-09-25 MED ORDER — VALACYCLOVIR HCL 500 MG PO TABS: 500.0000 mg | ORAL_TABLET | Freq: Every day | ORAL | 3 refills | Status: DC

## 2017-09-25 NOTE — Assessment & Plan Note (Signed)
Stable, continue present medications.   

## 2017-09-25 NOTE — Assessment & Plan Note (Signed)
Improved since leaving job

## 2017-09-25 NOTE — Assessment & Plan Note (Signed)
Stable on chronic Valtrex

## 2017-09-25 NOTE — Progress Notes (Signed)
BP (!) 139/93 (BP Location: Left Arm, Cuff Size: Normal)   Pulse 73   Temp 99 F (37.2 C) (Oral)   Ht 5' 5.5" (1.664 m)   Wt 171 lb 12.8 oz (77.9 kg)   LMP 01/16/2010 (Approximate)   SpO2 98%   BMI 28.15 kg/m    Subjective:    Patient ID: Jo Martinez, female    DOB: Mar 24, 1960, 57 y.o.   MRN: [id number]  HPI: Jo Martinez is a 57 y.o. female  Chief Complaint  Patient presents with  . Annual Exam   Hypertension Using medications without difficulty Average home BPs   No problems or lightheadedness No chest pain with exertion or shortness of breath No Edema  HSV Doing well with Valtrex.  Having no outbreaks  Low B12 Discussed last visit.  Not taking oral supplements.    Depression Doing well at present dose.   Depression screen [facility] 2/9 09/25/2017 06/12/2017 12/18/2016 09/11/2016 08/15/2015  Decreased Interest 0 0 0 0 0  Down, Depressed, Hopeless 0 0 0 0 0  PHQ - 2 Score 0 0 0 0 0  Altered sleeping 0 0 0 0 -  Tired, decreased energy 0 0 0 0 -  Change in appetite 0 0 0 0 -  Feeling bad or failure about yourself  0 0 0 0 -  Trouble concentrating 0 0 0 0 -  Moving slowly or fidgety/restless 0 0 0 0 -  Suicidal thoughts 0 0 0 0 -  PHQ-9 Score 0 0 0 0 -    Pt states she quit her job and is more active.    Social History   Socioeconomic History  . Marital status: Married    Spouse name: Not on file  . Number of children: Not on file  . Years of education: Not on file  . Highest education level: Not on file  Occupational History  . Not on file  Social Needs  . Financial resource strain: Not on file  . Food insecurity:    Worry: Not on file    Inability: Not on file  . Transportation needs:    Medical: Not on file    Non-medical: Not on file  Tobacco Use  . Smoking status: Never Smoker  . Smokeless tobacco: Never Used  Substance and Sexual Activity  . Alcohol use: No    Alcohol/week: 0.0 oz  . Drug use: No  . Sexual activity: Yes  Lifestyle  .  Physical activity:    Days per week: Not on file    Minutes per session: Not on file  . Stress: Not on file  Relationships  . Social connections:    Talks on phone: Not on file    Gets together: Not on file    Attends religious service: Not on file    Active member of club or organization: Not on file    Attends meetings of clubs or organizations: Not on file    Relationship status: Not on file  . Intimate partner violence:    Fear of current or ex partner: Not on file    Emotionally abused: Not on file    Physically abused: Not on file    Forced sexual activity: Not on file  Other Topics Concern  . Not on file  Social History Narrative  . Not on file   Family History  Problem Relation Age of Onset  . Lymphoma Mother   . Osteoporosis Mother   . Cancer Mother  lymphoma  . CAD Father   . Dementia Father   . Heart disease Father   . Hyperlipidemia Father   . Hypertension Father   . Arthritis Sister   . Arthritis Brother   . Hyperlipidemia Brother    Past Medical History:  Diagnosis Date  . Anxiety   . Dental crown present    Implant - top front  . Depression   . Diverticulitis   . Genital herpes   . Hyperlipidemia   . Hypertension   . Sleep apnea    stopped using CPAP after wt loss(8/16)  . Wears contact lenses    Past Surgical History:  Procedure Laterality Date  . COLON SURGERY  11/03/2015   partial colon removal   . COLONOSCOPY N/A 07/06/2015   Procedure: COLONOSCOPY;  Surgeon: Wallace Cullens, MD;  Location: [facility];  Service: Gastroenterology;  Laterality: N/A;  SLEEP APNEA - no CPAP  . DRAINAGE TUBE REMOVAL ([facility] HX)  10/18/14   [facility]  . TONSILLECTOMY     Relevant past medical, surgical, family and social history reviewed and updated as indicated. Interim medical history since our last visit reviewed. Allergies and medications reviewed and updated.  Review of Systems  Constitutional: Negative.   HENT: Negative.   Eyes: Negative.     Respiratory: Negative.   Cardiovascular: Negative.   Gastrointestinal: Negative.   Endocrine: Negative.   Genitourinary: Negative.   Musculoskeletal: Negative.   Skin: Negative.   Allergic/Immunologic: Negative.   Neurological: Negative.   Hematological: Negative.   Psychiatric/Behavioral: Negative.     Per HPI unless specifically indicated above     Objective:    BP (!) 139/93 (BP Location: Left Arm, Cuff Size: Normal)   Pulse 73   Temp 99 F (37.2 C) (Oral)   Ht 5' 5.5" (1.664 m)   Wt 171 lb 12.8 oz (77.9 kg)   LMP 01/16/2010 (Approximate)   SpO2 98%   BMI 28.15 kg/m   Wt Readings from Last 3 Encounters:  09/25/17 171 lb 12.8 oz (77.9 kg)  06/12/17 174 lb (78.9 kg)  03/29/17 179 lb 6.4 oz (81.4 kg)    Physical Exam  Constitutional: She is oriented to person, place, and time. She appears well-developed and well-nourished.  HENT:  Head: Normocephalic and atraumatic.  Eyes: Pupils are equal, round, and reactive to light. Right eye exhibits no discharge. Left eye exhibits no discharge. No scleral icterus.  Neck: Normal range of motion. Neck supple. Carotid bruit is not present. No thyromegaly present.  Cardiovascular: Normal rate, regular rhythm and normal heart sounds. Exam reveals no gallop and no friction rub.  No murmur heard. Pulmonary/Chest: Effort normal and breath sounds normal. No respiratory distress. She has no wheezes. She has no rales. No breast tenderness or discharge.  Abdominal: Soft. Bowel sounds are normal. There is no tenderness. There is no rebound.  Genitourinary: No breast tenderness or discharge.  Musculoskeletal: Normal range of motion.  Lymphadenopathy:    She has no cervical adenopathy.  Neurological: She is alert and oriented to person, place, and time.  Skin: Skin is warm, dry and intact. No rash noted.  Psychiatric: She has a normal mood and affect. Her speech is normal and behavior is normal. Judgment and thought content normal. Cognition  and memory are normal.   Refusing pap smears.    Results for orders placed or performed in visit on 06/12/17  Comprehensive metabolic panel  Result Value Ref Range   Glucose 111 (H) 65 -  99 mg/dL   BUN 16 6 - 24 mg/dL   Creatinine, Ser 1.611.10 (H) 0.57 - 1.00 mg/dL   GFR calc non Af Amer 57 (L) >59 mL/min/1.73   GFR calc Af Amer 65 >59 mL/min/1.73   BUN/Creatinine Ratio 15 9 - 23   Sodium 141 134 - 144 mmol/L   Potassium 3.8 3.5 - 5.2 mmol/L   Chloride 100 96 - 106 mmol/L   CO2 23 20 - 29 mmol/L   Calcium 9.7 8.7 - 10.2 mg/dL   Total Protein 7.1 6.0 - 8.5 g/dL   Albumin 4.6 3.5 - 5.5 g/dL   Globulin, Total 2.5 1.5 - 4.5 g/dL   Albumin/Globulin Ratio 1.8 1.2 - 2.2   Bilirubin Total 0.7 0.0 - 1.2 mg/dL   Alkaline Phosphatase 63 39 - 117 IU/L   AST 46 (H) 0 - 40 IU/L   ALT 35 (H) 0 - 32 IU/L  CBC with Differential/Platelet  Result Value Ref Range   WBC 8.8 3.4 - 10.8 x10E3/uL   RBC 4.28 3.77 - 5.28 x10E6/uL   Hemoglobin 16.4 (H) 11.1 - 15.9 g/dL   Hematocrit 09.646.9 (H) 04.534.0 - 46.6 %   MCV 110 (H) 79 - 97 fL   MCH 38.3 (H) 26.6 - 33.0 pg   MCHC 35.0 31.5 - 35.7 g/dL   RDW 40.914.2 81.112.3 - 91.415.4 %   Platelets 248 150 - 379 x10E3/uL   Neutrophils 58 Not Estab. %   Lymphs 34 Not Estab. %   Monocytes 6 Not Estab. %   Eos 2 Not Estab. %   Basos 0 Not Estab. %   Neutrophils Absolute 5.1 1.4 - 7.0 x10E3/uL   Lymphocytes Absolute 3.0 0.7 - 3.1 x10E3/uL   Monocytes Absolute 0.5 0.1 - 0.9 x10E3/uL   EOS (ABSOLUTE) 0.1 0.0 - 0.4 x10E3/uL   Basophils Absolute 0.0 0.0 - 0.2 x10E3/uL   Immature Granulocytes 0 Not Estab. %   Immature Grans (Abs) 0.0 0.0 - 0.1 x10E3/uL  Vitamin B12  Result Value Ref Range   Vitamin B-12 210 (L) 232 - 1,245 pg/mL      Assessment & Plan:   Problem List Items Addressed This Visit      Unprioritized   Eczema    Improved since leaving job      Genital herpes    Stable on chronic Valtrex      Relevant Medications   valACYclovir (VALTREX) 500 MG tablet     Hypertension    Stable, continue present medications.        Relevant Medications   losartan-hydrochlorothiazide (HYZAAR) 100-25 MG tablet   Other Relevant Orders   Comprehensive metabolic panel   Lipid Panel w/o Chol/HDL Ratio   Low serum vitamin B12    Noted last visit.  Not supplementing.  Asymptomatic.  Plan is to take oral supplements for 3 months and then recheck.  Injections if needed      Relevant Orders   Vitamin B12   Major depressive disorder, single episode, in remission (HCC)    Stable, continue present medications.        Relevant Medications   FLUoxetine (PROZAC) 20 MG capsule    Other Visit Diagnoses    Annual physical exam    -  Primary   Relevant Orders   TSH   CBC with Differential/Platelet   MM DIGITAL SCREENING BILATERAL       Follow up plan: Return in about 6 months (around 03/28/2018).

## 2017-09-25 NOTE — Patient Instructions (Signed)
Please do call to schedule your mammogram; the number to schedule one at either [facility] or [facility] is [phone]   

## 2017-09-25 NOTE — Assessment & Plan Note (Signed)
Noted last visit.  Not supplementing.  Asymptomatic.  Plan is to take oral supplements for 3 months and then recheck.  Injections if needed

## 2017-09-26 LAB — COMPREHENSIVE METABOLIC PANEL
A/G RATIO: 2.1 (ref 1.2–2.2)
ALBUMIN: 5 g/dL (ref 3.5–5.5)
ALT: 35 IU/L — ABNORMAL HIGH (ref 0–32)
AST: 49 IU/L — ABNORMAL HIGH (ref 0–40)
Alkaline Phosphatase: 74 IU/L (ref 39–117)
BILIRUBIN TOTAL: 0.6 mg/dL (ref 0.0–1.2)
BUN / CREAT RATIO: 8 — AB (ref 9–23)
BUN: 8 mg/dL (ref 6–24)
CO2: 23 mmol/L (ref 20–29)
Calcium: 10.2 mg/dL (ref 8.7–10.2)
Chloride: 98 mmol/L (ref 96–106)
Creatinine, Ser: 0.99 mg/dL (ref 0.57–1.00)
GFR calc non Af Amer: 64 mL/min/{1.73_m2} (ref >59)
GFR, EST AFRICAN AMERICAN: 74 mL/min/{1.73_m2} (ref >59)
Globulin, Total: 2.4 g/dL (ref 1.5–4.5)
Glucose: 74 mg/dL (ref 65–99)
POTASSIUM: 3.7 mmol/L (ref 3.5–5.2)
Sodium: 142 mmol/L (ref 134–144)
TOTAL PROTEIN: 7.4 g/dL (ref 6.0–8.5)

## 2017-09-26 LAB — LIPID PANEL W/O CHOL/HDL RATIO
Cholesterol, Total: 242 mg/dL — ABNORMAL HIGH (ref 100–199)
HDL: 44 mg/dL (ref >39)
LDL Calculated: 149 mg/dL — ABNORMAL HIGH (ref 0–99)
Triglycerides: 243 mg/dL — ABNORMAL HIGH (ref 0–149)
VLDL Cholesterol Cal: 49 mg/dL — ABNORMAL HIGH (ref 5–40)

## 2017-09-26 LAB — TSH: TSH: 2.98 u[IU]/mL (ref 0.450–4.500)

## 2017-09-26 LAB — CBC WITH DIFFERENTIAL/PLATELET
BASOS ABS: 0 10*3/uL (ref 0.0–0.2)
Basos: 0 %
EOS (ABSOLUTE): 0.1 10*3/uL (ref 0.0–0.4)
EOS: 1 %
HEMOGLOBIN: 16.1 g/dL — AB (ref 11.1–15.9)
Hematocrit: 46.9 % — ABNORMAL HIGH (ref 34.0–46.6)
IMMATURE GRANULOCYTES: 0 %
Immature Grans (Abs): 0 10*3/uL (ref 0.0–0.1)
LYMPHS: 41 %
Lymphocytes Absolute: 3.2 10*3/uL — ABNORMAL HIGH (ref 0.7–3.1)
MCH: 38.7 pg — AB (ref 26.6–33.0)
MCHC: 34.3 g/dL (ref 31.5–35.7)
MCV: 113 fL — AB (ref 79–97)
Monocytes Absolute: 0.5 10*3/uL (ref 0.1–0.9)
Monocytes: 6 %
Neutrophils Absolute: 4 10*3/uL (ref 1.4–7.0)
Neutrophils: 52 %
PLATELETS: 277 10*3/uL (ref 150–450)
RBC: 4.16 x10E6/uL (ref 3.77–5.28)
RDW: 14.2 % (ref 12.3–15.4)
WBC: 7.7 10*3/uL (ref 3.4–10.8)

## 2017-09-26 LAB — VITAMIN B12: Vitamin B-12: 204 pg/mL — ABNORMAL LOW (ref 232–1245)

## 2017-12-03 ENCOUNTER — Other Ambulatory Visit: Payer: Self-pay | Admitting: Unknown Physician Specialty

## 2018-01-08 ENCOUNTER — Other Ambulatory Visit: Payer: Self-pay | Admitting: Nurse Practitioner

## 2018-01-08 MED ORDER — CIPROFLOXACIN HCL 500 MG PO TABS: 500.0000 mg | ORAL_TABLET | Freq: Two times a day (BID) | ORAL | 0 refills | Status: AC

## 2018-01-08 MED ORDER — METRONIDAZOLE 500 MG PO TABS: 500.0000 mg | ORAL_TABLET | Freq: Three times a day (TID) | ORAL | 0 refills | Status: DC

## 2018-01-08 NOTE — Telephone Encounter (Signed)
Called patient, no answer. Left voicemail for pt to give us a call back. Will try to call later

## 2018-01-08 NOTE — Progress Notes (Signed)
Patient wrote in complaining of diverticulitis flare.  [place] that [facility] [place] if symptoms present to let her know and will prescribe Cipro 500 BID and Flagyl 500 TID x 7 days.  Message sent to this provider.  Will prescribe these medications at this time, but have advised staff to notify patient that if any worsening symptoms present they are immediately to go to ER as IV therapy may be required.  Reviewed chart, no known allergies and it appears this regimen has worked for patient in past.

## 2018-01-10 ENCOUNTER — Encounter: Payer: Self-pay | Admitting: Unknown Physician Specialty

## 2018-01-10 ENCOUNTER — Ambulatory Visit: Payer: BC Managed Care – PPO | Admitting: Unknown Physician Specialty

## 2018-01-10 DIAGNOSIS — D7589 Other specified diseases of blood and blood-forming organs: Secondary | ICD-10-CM

## 2018-01-10 DIAGNOSIS — K5732 Diverticulitis of large intestine without perforation or abscess without bleeding: Secondary | ICD-10-CM | POA: Diagnosis not present

## 2018-01-10 DIAGNOSIS — E538 Deficiency of other specified B group vitamins: Secondary | ICD-10-CM

## 2018-01-10 DIAGNOSIS — I10 Essential (primary) hypertension: Secondary | ICD-10-CM | POA: Diagnosis not present

## 2018-01-10 MED ORDER — LOSARTAN POTASSIUM-HCTZ 100-25 MG PO TABS: 1.0000 | ORAL_TABLET | Freq: Every day | ORAL | 0 refills | Status: DC

## 2018-01-10 NOTE — Assessment & Plan Note (Signed)
Stable, continue present medications.   

## 2018-01-10 NOTE — Assessment & Plan Note (Signed)
Check today 

## 2018-01-10 NOTE — Assessment & Plan Note (Signed)
Check B12 and Folate

## 2018-01-10 NOTE — Assessment & Plan Note (Addendum)
Improving on currently therapy.  Discussed ideally to be seen for flare.  Refusing f/u colonoscopy.  Last one 3 years ago.  Current regimen causes nausea and would like to consider alternative such as solo Augmentin

## 2018-01-10 NOTE — Progress Notes (Signed)
BP (!) 133/97 (BP Location: Left Arm, Cuff Size: Normal)   Pulse 76   Temp 98.7 F (37.1 C) (Oral)   Wt 174 lb 9.6 oz (79.2 kg)   LMP 01/16/2010 (Approximate)   SpO2 96%   BMI 28.61 kg/m    Subjective:    Patient ID: Jo Martinez, female    DOB: 1960/11/19, 57 y.o.   MRN: [id number]  HPI: Jo Martinez is a 57 y.o. female  Chief Complaint  Patient presents with  . Hypertension   Hypertension  Using medications without difficulty Average home BPs   Using medication without problems or lightheadedness No chest pain with exertion or shortness of breath No Edema  B12 deficiency Started oral supplements.    Diverticulitis Improving with antibiotics.  However nausea with current antibiotics   Relevant past medical, surgical, family and social history reviewed and updated as indicated. Interim medical history since our last visit reviewed. Allergies and medications reviewed and updated.  Review of Systems  Constitutional: Negative.   HENT: Negative.   Eyes: Negative.   Respiratory: Negative.   Cardiovascular: Negative.   Gastrointestinal: Negative.   Endocrine: Negative.   Genitourinary: Negative.   Musculoskeletal: Negative.   Skin: Negative.   Allergic/Immunologic: Negative.   Neurological: Negative.   Hematological: Negative.   Psychiatric/Behavioral: Negative.     Per HPI unless specifically indicated above     Objective:    BP (!) 133/97 (BP Location: Left Arm, Cuff Size: Normal)   Pulse 76   Temp 98.7 F (37.1 C) (Oral)   Wt 174 lb 9.6 oz (79.2 kg)   LMP 01/16/2010 (Approximate)   SpO2 96%   BMI 28.61 kg/m   Wt Readings from Last 3 Encounters:  01/10/18 174 lb 9.6 oz (79.2 kg)  09/25/17 171 lb 12.8 oz (77.9 kg)  06/12/17 174 lb (78.9 kg)    Physical Exam  Constitutional: She is oriented to person, place, and time. She appears well-developed and well-nourished. No distress.  HENT:  Head: Normocephalic and atraumatic.  Eyes: Conjunctivae  and lids are normal. Right eye exhibits no discharge. Left eye exhibits no discharge. No scleral icterus.  Neck: Normal range of motion. Neck supple. No JVD present. Carotid bruit is not present.  Cardiovascular: Normal rate, regular rhythm and normal heart sounds.  Pulmonary/Chest: Effort normal and breath sounds normal.  Abdominal: Soft. Normal appearance and bowel sounds are normal. She exhibits no distension. There is no splenomegaly or hepatomegaly. There is no tenderness. There is no rebound and no guarding.  Musculoskeletal: Normal range of motion.  Neurological: She is alert and oriented to person, place, and time.  Skin: Skin is warm, dry and intact. No rash noted. No pallor.  Psychiatric: She has a normal mood and affect. Her behavior is normal. Judgment and thought content normal.    Results for orders placed or performed in visit on 09/25/17  TSH  Result Value Ref Range   TSH 2.980 0.450 - 4.500 uIU/mL  CBC with Differential/Platelet  Result Value Ref Range   WBC 7.7 3.4 - 10.8 x10E3/uL   RBC 4.16 3.77 - 5.28 x10E6/uL   Hemoglobin 16.1 (H) 11.1 - 15.9 g/dL   Hematocrit 40.946.9 (H) 81.134.0 - 46.6 %   MCV 113 (H) 79 - 97 fL   MCH 38.7 (H) 26.6 - 33.0 pg   MCHC 34.3 31.5 - 35.7 g/dL   RDW 91.414.2 78.212.3 - 95.615.4 %   Platelets 277 150 - 450 x10E3/uL   Neutrophils  52 Not Estab. %   Lymphs 41 Not Estab. %   Monocytes 6 Not Estab. %   Eos 1 Not Estab. %   Basos 0 Not Estab. %   Neutrophils Absolute 4.0 1.4 - 7.0 x10E3/uL   Lymphocytes Absolute 3.2 (H) 0.7 - 3.1 x10E3/uL   Monocytes Absolute 0.5 0.1 - 0.9 x10E3/uL   EOS (ABSOLUTE) 0.1 0.0 - 0.4 x10E3/uL   Basophils Absolute 0.0 0.0 - 0.2 x10E3/uL   Immature Granulocytes 0 Not Estab. %   Immature Grans (Abs) 0.0 0.0 - 0.1 x10E3/uL  Comprehensive metabolic panel  Result Value Ref Range   Glucose 74 65 - 99 mg/dL   BUN 8 6 - 24 mg/dL   Creatinine, Ser 1.61 0.57 - 1.00 mg/dL   GFR calc non Af Amer 64 >59 mL/min/1.73   GFR calc Af Amer  74 >59 mL/min/1.73   BUN/Creatinine Ratio 8 (L) 9 - 23   Sodium 142 134 - 144 mmol/L   Potassium 3.7 3.5 - 5.2 mmol/L   Chloride 98 96 - 106 mmol/L   CO2 23 20 - 29 mmol/L   Calcium 10.2 8.7 - 10.2 mg/dL   Total Protein 7.4 6.0 - 8.5 g/dL   Albumin 5.0 3.5 - 5.5 g/dL   Globulin, Total 2.4 1.5 - 4.5 g/dL   Albumin/Globulin Ratio 2.1 1.2 - 2.2   Bilirubin Total 0.6 0.0 - 1.2 mg/dL   Alkaline Phosphatase 74 39 - 117 IU/L   AST 49 (H) 0 - 40 IU/L   ALT 35 (H) 0 - 32 IU/L  Lipid Panel w/o Chol/HDL Ratio  Result Value Ref Range   Cholesterol, Total 242 (H) 100 - 199 mg/dL   Triglycerides 096 (H) 0 - 149 mg/dL   HDL 44 >04 mg/dL   VLDL Cholesterol Cal 49 (H) 5 - 40 mg/dL   LDL Calculated 540 (H) 0 - 99 mg/dL  Vitamin J81  Result Value Ref Range   Vitamin B-12 204 (L) 232 - 1,245 pg/mL      Assessment & Plan:   Problem List Items Addressed This Visit      Unprioritized   Diverticulitis large intestine    Improving on currently therapy.  Discussed ideally to be seen for flare.  Refusing f/u colonoscopy.  Last one 3 years ago.  Current regimen causes nausea and would like to consider alternative such as solo Augmentin      Relevant Orders   CBC with Differential/Platelet   Hypertension    Stable, continue present medications.        Relevant Medications   losartan-hydrochlorothiazide (HYZAAR) 100-25 MG tablet   Other Relevant Orders   Comprehensive metabolic panel   Low serum vitamin B12    Check today      Relevant Orders   B12 and Folate Panel   Macrocytosis without anemia    Check B12 and Folate      Relevant Orders   B12 and Folate Panel   CBC with Differential/Platelet       Follow up plan: Return if symptoms worsen or fail to improve.

## 2018-01-11 LAB — COMPREHENSIVE METABOLIC PANEL
A/G RATIO: 2.3 — AB (ref 1.2–2.2)
ALK PHOS: 62 IU/L (ref 39–117)
ALT: 35 IU/L — AB (ref 0–32)
AST: 54 IU/L — AB (ref 0–40)
Albumin: 4.6 g/dL (ref 3.5–5.5)
BILIRUBIN TOTAL: 0.3 mg/dL (ref 0.0–1.2)
BUN/Creatinine Ratio: 12 (ref 9–23)
BUN: 11 mg/dL (ref 6–24)
CALCIUM: 9.4 mg/dL (ref 8.7–10.2)
CO2: 21 mmol/L (ref 20–29)
Chloride: 105 mmol/L (ref 96–106)
Creatinine, Ser: 0.94 mg/dL (ref 0.57–1.00)
GFR calc Af Amer: 79 mL/min/{1.73_m2} (ref >59)
GFR calc non Af Amer: 69 mL/min/{1.73_m2} (ref >59)
Globulin, Total: 2 g/dL (ref 1.5–4.5)
Glucose: 112 mg/dL — ABNORMAL HIGH (ref 65–99)
POTASSIUM: 3.5 mmol/L (ref 3.5–5.2)
Sodium: 142 mmol/L (ref 134–144)
Total Protein: 6.6 g/dL (ref 6.0–8.5)

## 2018-01-11 LAB — CBC WITH DIFFERENTIAL/PLATELET
BASOS ABS: 0 10*3/uL (ref 0.0–0.2)
Basos: 1 %
EOS (ABSOLUTE): 0.1 10*3/uL (ref 0.0–0.4)
Eos: 2 %
Hematocrit: 46.1 % (ref 34.0–46.6)
Hemoglobin: 16.7 g/dL — ABNORMAL HIGH (ref 11.1–15.9)
IMMATURE GRANS (ABS): 0 10*3/uL (ref 0.0–0.1)
IMMATURE GRANULOCYTES: 0 %
LYMPHS: 26 %
Lymphocytes Absolute: 2.3 10*3/uL (ref 0.7–3.1)
MCH: 36.4 pg — AB (ref 26.6–33.0)
MCHC: 36.2 g/dL — ABNORMAL HIGH (ref 31.5–35.7)
MCV: 100 fL — ABNORMAL HIGH (ref 79–97)
Monocytes Absolute: 0.8 10*3/uL (ref 0.1–0.9)
Monocytes: 9 %
NEUTROS PCT: 62 %
Neutrophils Absolute: 5.4 10*3/uL (ref 1.4–7.0)
Platelets: 272 10*3/uL (ref 150–450)
RBC: 4.59 x10E6/uL (ref 3.77–5.28)
RDW: 11.3 % — ABNORMAL LOW (ref 12.3–15.4)
WBC: 8.7 10*3/uL (ref 3.4–10.8)

## 2018-01-11 LAB — B12 AND FOLATE PANEL
FOLATE: 8.8 ng/mL (ref >3.0)
VITAMIN B 12: 609 pg/mL (ref 232–1245)

## 2018-03-11 ENCOUNTER — Other Ambulatory Visit: Payer: Self-pay | Admitting: Unknown Physician Specialty

## 2018-03-11 NOTE — Telephone Encounter (Signed)
Requested Prescriptions  Pending Prescriptions Disp Refills  . losartan-hydrochlorothiazide (HYZAAR) 100-25 MG tablet [Pharmacy Med Name: LOSARTAN/HCTZ 100/25MG  TABLETS] 90 tablet 0    Sig: TAKE 1 TABLET BY MOUTH ONCE DAILY     Cardiovascular: ARB + Diuretic Combos Failed - 03/11/2018  3:38 AM      Failed - Last BP in normal range    BP Readings from Last 1 Encounters:  01/10/18 (!) 133/97         Passed - K in normal range and within 180 days    Potassium  Date Value Ref Range Status  01/10/2018 3.5 3.5 - 5.2 mmol/L Final         Passed - Na in normal range and within 180 days    Sodium  Date Value Ref Range Status  01/10/2018 142 134 - 144 mmol/L Final         Passed - Cr in normal range and within 180 days    Creatinine, Ser  Date Value Ref Range Status  01/10/2018 0.94 0.57 - 1.00 mg/dL Final         Passed - Ca in normal range and within 180 days    Calcium  Date Value Ref Range Status  01/10/2018 9.4 8.7 - 10.2 mg/dL Final         Passed - Patient is not pregnant      Passed - Valid encounter within last 6 months    Recent Outpatient Visits          2 months ago Diverticulitis of large intestine without perforation or abscess without bleeding   [facility] Gabriel Cirri, NP   5 months ago Annual physical exam   [facility] Gabriel Cirri, NP   9 months ago Macrocytosis without anemia   [facility] Gabriel Cirri, NP   11 months ago Right otitis media with effusion   [facility] Particia Nearing, [place]   1 year ago Essential hypertension   [facility] Gabriel Cirri, NP

## 2018-07-31 ENCOUNTER — Ambulatory Visit: Payer: BC Managed Care – PPO | Admitting: Family Medicine

## 2018-07-31 ENCOUNTER — Encounter: Payer: Self-pay | Admitting: Family Medicine

## 2018-07-31 ENCOUNTER — Other Ambulatory Visit: Payer: Self-pay

## 2018-07-31 VITALS — BP 143/91 | HR 87 | Temp 98.1°F | Ht 65.5 in | Wt 165.0 lb

## 2018-07-31 DIAGNOSIS — R202 Paresthesia of skin: Secondary | ICD-10-CM | POA: Diagnosis not present

## 2018-07-31 DIAGNOSIS — M545 Low back pain, unspecified: Secondary | ICD-10-CM

## 2018-07-31 MED ORDER — CYCLOBENZAPRINE HCL 10 MG PO TABS: 10.0000 mg | ORAL_TABLET | Freq: Every day | ORAL | 0 refills | Status: DC

## 2018-07-31 MED ORDER — NAPROXEN 500 MG PO TABS: 500.0000 mg | ORAL_TABLET | Freq: Two times a day (BID) | ORAL | 0 refills | Status: DC

## 2018-07-31 NOTE — Progress Notes (Signed)
BP (!) 143/91   Pulse 87   Temp 98.1 F (36.7 C) (Oral)   Ht 5' 5.5" (1.664 m)   Wt 165 lb (74.8 kg)   LMP 01/16/2010 (Approximate)   SpO2 98%   BMI 27.04 kg/m    Subjective:    Patient ID: Jo Martinez, female    DOB: [birth year], 58 y.o.   MRN: [id number]  HPI: Jo Martinez is a 58 y.o. female  Chief Complaint  Patient presents with  . Follow-up  . Back Pain    Lower back. Denies injury. Ongoing since Feburary. Worsening. Now experiencing numbness in feet and toes, bilaterally. occassionally up to knees   BACK PAIN Duration: about 3-4 months Mechanism of injury: unknown Location: R>L and low back Onset: gradual Severity: moderate Quality: like a knot, dull Frequency: constant Radiation: R leg below the knee Aggravating factors: walking  Alleviating factors: nothing Status: worse Treatments attempted: rest, ice, heat, APAP, ibuprofen and aleve  Relief with NSAIDs?: no Nighttime pain:  no Paresthesias / decreased sensation:  yes Bowel / bladder incontinence:  no Fevers:  no Dysuria / urinary frequency:  no   Relevant past medical, surgical, family and social history reviewed and updated as indicated. Interim medical history since our last visit reviewed. Allergies and medications reviewed and updated.  Review of Systems  Constitutional: Negative.   Respiratory: Negative.   Cardiovascular: Negative.   Gastrointestinal: Negative.   Genitourinary: Negative.   Musculoskeletal: Positive for back pain and myalgias. Negative for arthralgias, gait problem, joint swelling, neck pain and neck stiffness.  Skin: Negative.   Neurological: Negative for weakness.  Psychiatric/Behavioral: Negative.     Per HPI unless specifically indicated above     Objective:    BP (!) 143/91   Pulse 87   Temp 98.1 F (36.7 C) (Oral)   Ht 5' 5.5" (1.664 m)   Wt 165 lb (74.8 kg)   LMP 01/16/2010 (Approximate)   SpO2 98%   BMI 27.04 kg/m   Wt Readings from Last 3  Encounters:  07/31/18 165 lb (74.8 kg)  01/10/18 174 lb 9.6 oz (79.2 kg)  09/25/17 171 lb 12.8 oz (77.9 kg)    Physical Exam Vitals signs and nursing note reviewed.  Constitutional:      General: She is not in acute distress.    Appearance: Normal appearance. She is not ill-appearing, toxic-appearing or diaphoretic.  HENT:     Head: Normocephalic and atraumatic.     Right Ear: External ear normal.     Left Ear: External ear normal.     Nose: Nose normal.     Mouth/Throat:     Mouth: Mucous membranes are moist.     Pharynx: Oropharynx is clear.  Eyes:     General: No scleral icterus.       Right eye: No discharge.        Left eye: No discharge.     Extraocular Movements: Extraocular movements intact.     Conjunctiva/sclera: Conjunctivae normal.     Pupils: Pupils are equal, round, and reactive to light.  Neck:     Musculoskeletal: Normal range of motion and neck supple.  Cardiovascular:     Rate and Rhythm: Normal rate and regular rhythm.     Pulses: Normal pulses.     Heart sounds: Normal heart sounds. No murmur. No friction rub. No gallop.   Pulmonary:     Effort: Pulmonary effort is normal. No respiratory distress.     Breath  sounds: Normal breath sounds. No stridor. No wheezing, rhonchi or rales.  Chest:     Chest wall: No tenderness.  Musculoskeletal: Normal range of motion.  Skin:    General: Skin is warm and dry.     Capillary Refill: Capillary refill takes less than 2 seconds.     Coloration: Skin is not jaundiced or pale.     Findings: No bruising, erythema, lesion or rash.  Neurological:     General: No focal deficit present.     Mental Status: She is alert and oriented to person, place, and time. Mental status is at baseline.  Psychiatric:        Mood and Affect: Mood normal.        Behavior: Behavior normal.        Thought Content: Thought content normal.        Judgment: Judgment normal.   Back Exam:    Inspection:  Normal spinal curvature.  No  deformity, ecchymosis, erythema, or lesions     Palpation:     Midline spinal tenderness: no      Paralumbar tenderness: yes Right     Parathoracic tenderness: no      Buttocks tenderness: no     Range of Motion:      Flexion: Fingers to Knees     Extension:Decreased     Lateral bending:Decreased    Rotation:Decreased    Neuro Exam:Lower extremity DTRs normal & symmetric.  Strength and sensation intact.    Special Tests:      Straight leg raise:negative   Results for orders placed or performed in visit on 01/10/18  B12 and Folate Panel  Result Value Ref Range   Vitamin B-12 609 232 - 1,245 pg/mL   Folate 8.8 >3.0 ng/mL  Comprehensive metabolic panel  Result Value Ref Range   Glucose 112 (H) 65 - 99 mg/dL   BUN 11 6 - 24 mg/dL   Creatinine, Ser 7.61 0.57 - 1.00 mg/dL   GFR calc non Af Amer 69 >59 mL/min/1.73   GFR calc Af Amer 79 >59 mL/min/1.73   BUN/Creatinine Ratio 12 9 - 23   Sodium 142 134 - 144 mmol/L   Potassium 3.5 3.5 - 5.2 mmol/L   Chloride 105 96 - 106 mmol/L   CO2 21 20 - 29 mmol/L   Calcium 9.4 8.7 - 10.2 mg/dL   Total Protein 6.6 6.0 - 8.5 g/dL   Albumin 4.6 3.5 - 5.5 g/dL   Globulin, Total 2.0 1.5 - 4.5 g/dL   Albumin/Globulin Ratio 2.3 (H) 1.2 - 2.2   Bilirubin Total 0.3 0.0 - 1.2 mg/dL   Alkaline Phosphatase 62 39 - 117 IU/L   AST 54 (H) 0 - 40 IU/L   ALT 35 (H) 0 - 32 IU/L  CBC with Differential/Platelet  Result Value Ref Range   WBC 8.7 3.4 - 10.8 x10E3/uL   RBC 4.59 3.77 - 5.28 x10E6/uL   Hemoglobin 16.7 (H) 11.1 - 15.9 g/dL   Hematocrit 84.8 59.2 - 46.6 %   MCV 100 (H) 79 - 97 fL   MCH 36.4 (H) 26.6 - 33.0 pg   MCHC 36.2 (H) 31.5 - 35.7 g/dL   RDW 76.3 (L) 94.3 - 20.0 %   Platelets 272 150 - 450 x10E3/uL   Neutrophils 62 Not Estab. %   Lymphs 26 Not Estab. %   Monocytes 9 Not Estab. %   Eos 2 Not Estab. %   Basos 1 Not Estab. %  Neutrophils Absolute 5.4 1.4 - 7.0 x10E3/uL   Lymphocytes Absolute 2.3 0.7 - 3.1 x10E3/uL   Monocytes  Absolute 0.8 0.1 - 0.9 x10E3/uL   EOS (ABSOLUTE) 0.1 0.0 - 0.4 x10E3/uL   Basophils Absolute 0.0 0.0 - 0.2 x10E3/uL   Immature Granulocytes 0 Not Estab. %   Immature Grans (Abs) 0.0 0.0 - 0.1 x10E3/uL      Assessment & Plan:   Problem List Items Addressed This Visit    None    Visit Diagnoses    Acute right-sided low back pain without sciatica    -  Primary   Will start exercises, naproxen and flexeril. Check x-ray. Await results. Call with any concerns. Recheck 2 weeks.    Relevant Medications   cyclobenzaprine (FLEXERIL) 10 MG tablet   naproxen (NAPROSYN) 500 MG tablet   Other Relevant Orders   DG Lumbar Spine Complete   Paresthesia       Likely due to back, but will check labs to look for other cause. Await results. Call with any concerns.    Relevant Orders   CBC with Differential/Platelet   Bayer DCA Hb A1c Waived   Comprehensive metabolic panel   Lipid Panel w/o Chol/HDL Ratio   TSH   UA/M w/rflx Culture, Routine       Follow up plan: Return in about 2 weeks (around 08/14/2018) for follow up back pain.

## 2018-07-31 NOTE — Patient Instructions (Signed)

## 2018-08-01 ENCOUNTER — Encounter: Payer: Self-pay | Admitting: Family Medicine

## 2018-08-01 LAB — CBC WITH DIFFERENTIAL/PLATELET
Basophils Absolute: 0 10*3/uL (ref 0.0–0.2)
Basos: 1 %
EOS (ABSOLUTE): 0.1 10*3/uL (ref 0.0–0.4)
Eos: 2 %
Hematocrit: 47.4 % — ABNORMAL HIGH (ref 34.0–46.6)
Hemoglobin: 17 g/dL — ABNORMAL HIGH (ref 11.1–15.9)
Immature Grans (Abs): 0 10*3/uL (ref 0.0–0.1)
Immature Granulocytes: 0 %
Lymphocytes Absolute: 3 10*3/uL (ref 0.7–3.1)
Lymphs: 35 %
MCH: 37.1 pg — ABNORMAL HIGH (ref 26.6–33.0)
MCHC: 35.9 g/dL — ABNORMAL HIGH (ref 31.5–35.7)
MCV: 104 fL — ABNORMAL HIGH (ref 79–97)
Monocytes Absolute: 0.5 10*3/uL (ref 0.1–0.9)
Monocytes: 6 %
Neutrophils Absolute: 4.8 10*3/uL (ref 1.4–7.0)
Neutrophils: 56 %
Platelets: 261 10*3/uL (ref 150–450)
RBC: 4.58 x10E6/uL (ref 3.77–5.28)
RDW: 12.6 % (ref 11.7–15.4)
WBC: 8.5 10*3/uL (ref 3.4–10.8)

## 2018-08-01 LAB — COMPREHENSIVE METABOLIC PANEL
ALT: 33 IU/L — ABNORMAL HIGH (ref 0–32)
AST: 48 IU/L — ABNORMAL HIGH (ref 0–40)
Albumin/Globulin Ratio: 1.9 (ref 1.2–2.2)
Albumin: 4.5 g/dL (ref 3.8–4.9)
Alkaline Phosphatase: 76 IU/L (ref 39–117)
BUN/Creatinine Ratio: 11 (ref 9–23)
BUN: 10 mg/dL (ref 6–24)
Bilirubin Total: 0.4 mg/dL (ref 0.0–1.2)
CO2: 24 mmol/L (ref 20–29)
Calcium: 10.1 mg/dL (ref 8.7–10.2)
Chloride: 102 mmol/L (ref 96–106)
Creatinine, Ser: 0.93 mg/dL (ref 0.57–1.00)
GFR calc Af Amer: 79 mL/min/{1.73_m2} (ref >59)
GFR calc non Af Amer: 69 mL/min/{1.73_m2} (ref >59)
Globulin, Total: 2.4 g/dL (ref 1.5–4.5)
Glucose: 113 mg/dL — ABNORMAL HIGH (ref 65–99)
Potassium: 4.1 mmol/L (ref 3.5–5.2)
Sodium: 141 mmol/L (ref 134–144)
Total Protein: 6.9 g/dL (ref 6.0–8.5)

## 2018-08-01 LAB — TSH: TSH: 4.28 u[IU]/mL (ref 0.450–4.500)

## 2018-08-01 LAB — LIPID PANEL W/O CHOL/HDL RATIO
Cholesterol, Total: 239 mg/dL — ABNORMAL HIGH (ref 100–199)
HDL: 50 mg/dL (ref >39)
LDL Calculated: 144 mg/dL — ABNORMAL HIGH (ref 0–99)
Triglycerides: 225 mg/dL — ABNORMAL HIGH (ref 0–149)
VLDL Cholesterol Cal: 45 mg/dL — ABNORMAL HIGH (ref 5–40)

## 2018-08-02 LAB — UA/M W/RFLX CULTURE, ROUTINE
Bilirubin, UA: NEGATIVE
Glucose, UA: NEGATIVE
Ketones, UA: NEGATIVE
Leukocytes,UA: NEGATIVE
Nitrite, UA: NEGATIVE
Specific Gravity, UA: 1.01 (ref 1.005–1.030)
Urobilinogen, Ur: 0.2 mg/dL (ref 0.2–1.0)
pH, UA: 7 (ref 5.0–7.5)

## 2018-08-02 LAB — MICROSCOPIC EXAMINATION

## 2018-08-02 LAB — URINE CULTURE, REFLEX

## 2018-08-02 LAB — BAYER DCA HB A1C WAIVED: HB A1C (BAYER DCA - WAIVED): 5 % (ref <7.0)

## 2018-08-21 ENCOUNTER — Ambulatory Visit: Payer: BC Managed Care – PPO | Admitting: Family Medicine

## 2018-08-29 ENCOUNTER — Encounter: Payer: Self-pay | Admitting: Unknown Physician Specialty

## 2018-08-29 ENCOUNTER — Other Ambulatory Visit: Payer: Self-pay | Admitting: Unknown Physician Specialty

## 2018-08-29 NOTE — Telephone Encounter (Signed)
Forwarding medication refill to PCP for review. 

## 2018-10-31 ENCOUNTER — Other Ambulatory Visit: Payer: Self-pay | Admitting: Unknown Physician Specialty

## 2018-10-31 NOTE — Telephone Encounter (Signed)
Routing to provider  

## 2018-10-31 NOTE — Telephone Encounter (Signed)
Requested medication (s) are due for refill today: yes  Requested medication (s) are on the active medication list: yes  Last refill:  08/02/2018  Future visit scheduled: yes  Notes to clinic:  Review for refill   Requested Prescriptions  Pending Prescriptions Disp Refills   FLUoxetine (PROZAC) 20 MG capsule [Pharmacy Med Name: FLUOXETINE 20MG  CAPSULES] 90 capsule 3    Sig: TAKE 1 CAPSULE BY MOUTH EVERY DAY     Psychiatry:  Antidepressants - SSRI Passed - 10/31/2018  9:45 AM      Passed - Valid encounter within last 6 months    Recent Outpatient Visits          3 months ago Acute right-sided low back pain without sciatica   [place], Megan P, DO   9 months ago Diverticulitis of large intestine without perforation or abscess without bleeding   [facility] Kathrine Haddock, NP   1 year ago Annual physical exam   [facility] Kathrine Haddock, NP   1 year ago Macrocytosis without anemia   [facility] Kathrine Haddock, NP   1 year ago Right otitis media with effusion   [place], Lilia Argue, PA-C      Future Appointments            In 1 month Johnson, Megan P, DO [place], [facility]           Passed - Completed PHQ-2 or PHQ-9 in the last 360 days.       valACYclovir (VALTREX) 500 MG tablet [Pharmacy Med Name: VALACYCLOVIR 500MG  TABLETS] 90 tablet 3    Sig: TAKE 1 TABLET BY MOUTH EVERY DAY     Antimicrobials:  Antiviral Agents - Anti-Herpetic Passed - 10/31/2018  9:45 AM      Passed - Valid encounter within last 12 months    Recent Outpatient Visits          3 months ago Acute right-sided low back pain without sciatica   [place], Megan P, DO   9 months ago Diverticulitis of large intestine without perforation or abscess without bleeding   [facility] Kathrine Haddock, NP   1 year ago Annual physical exam   [facility] Kathrine Haddock,  NP   1 year ago Macrocytosis without anemia   [facility] Kathrine Haddock, NP   1 year ago Right otitis media with effusion   [facility] Volney American, [place]      Future Appointments            In 1 month Wynetta Emery, Barb Merino, DO [employer], [facility]

## 2018-11-25 ENCOUNTER — Encounter: Payer: Self-pay | Admitting: Family Medicine

## 2018-11-26 ENCOUNTER — Other Ambulatory Visit: Payer: Self-pay | Admitting: Family Medicine

## 2018-11-26 MED ORDER — LOSARTAN POTASSIUM-HCTZ 100-25 MG PO TABS: 1.0000 | ORAL_TABLET | Freq: Every day | ORAL | 0 refills | Status: DC

## 2018-12-29 ENCOUNTER — Encounter: Payer: BC Managed Care – PPO | Admitting: Family Medicine

## 2018-12-29 ENCOUNTER — Other Ambulatory Visit: Payer: Self-pay | Admitting: Family Medicine

## 2019-01-01 ENCOUNTER — Encounter: Payer: Self-pay | Admitting: Family Medicine

## 2019-01-05 ENCOUNTER — Ambulatory Visit: Payer: Self-pay | Admitting: Family Medicine

## 2019-01-27 ENCOUNTER — Other Ambulatory Visit: Payer: Self-pay | Admitting: Family Medicine

## 2019-01-31 ENCOUNTER — Other Ambulatory Visit: Payer: Self-pay | Admitting: Family Medicine

## 2019-02-01 NOTE — Telephone Encounter (Signed)
Courtesy refill  

## 2019-02-09 ENCOUNTER — Encounter: Payer: Self-pay | Admitting: Family Medicine

## 2019-03-02 ENCOUNTER — Other Ambulatory Visit: Payer: Self-pay | Admitting: Family Medicine

## 2019-03-02 NOTE — Telephone Encounter (Signed)
Requested medication (s) are due for refill today:  yes  Requested medication (s) are on the active medication list: yes  Last refill:  01/28/2019  Future visit scheduled: no  Notes to clinic:  no valid encounter within last 6 months Review for refill   Requested Prescriptions  Pending Prescriptions Disp Refills   losartan-hydrochlorothiazide (HYZAAR) 100-25 MG tablet [Pharmacy Med Name: LOSARTAN/HCTZ 100/25MG  TABLETS] 30 tablet 0    Sig: TAKE 1 TABLET BY MOUTH EVERY DAY      Cardiovascular: ARB + Diuretic Combos Failed - 03/02/2019  6:26 AM      Failed - K in normal range and within 180 days    Potassium  Date Value Ref Range Status  07/31/2018 4.1 3.5 - 5.2 mmol/L Final          Failed - Na in normal range and within 180 days    Sodium  Date Value Ref Range Status  07/31/2018 141 134 - 144 mmol/L Final          Failed - Cr in normal range and within 180 days    Creatinine, Ser  Date Value Ref Range Status  07/31/2018 0.93 0.57 - 1.00 mg/dL Final          Failed - Ca in normal range and within 180 days    Calcium  Date Value Ref Range Status  07/31/2018 10.1 8.7 - 10.2 mg/dL Final          Failed - Last BP in normal range    BP Readings from Last 1 Encounters:  07/31/18 (!) 143/91          Failed - Valid encounter within last 6 months    Recent Outpatient Visits           7 months ago Acute right-sided low back pain without sciatica   [facility] [place], Megan P, DO   1 year ago Diverticulitis of large intestine without perforation or abscess without bleeding   [facility] Kathrine Haddock, NP   1 year ago Annual physical exam   [facility] Kathrine Haddock, NP   1 year ago Macrocytosis without anemia   [facility] Kathrine Haddock, NP   1 year ago Right otitis media with effusion   [facility] Volney American, [place]              Passed - Patient is not pregnant        FLUoxetine (PROZAC) 20 MG capsule [Pharmacy Med Name: FLUOXETINE 20MG  CAPSULES] 30 capsule 0    Sig: TAKE 1 CAPSULE BY MOUTH EVERY DAY      Psychiatry:  Antidepressants - SSRI Failed - 03/02/2019  6:26 AM      Failed - Completed PHQ-2 or PHQ-9 in the last 360 days.      Failed - Valid encounter within last 6 months    Recent Outpatient Visits           7 months ago Acute right-sided low back pain without sciatica   [place], Megan P, DO   1 year ago Diverticulitis of large intestine without perforation or abscess without bleeding   [facility] Kathrine Haddock, NP   1 year ago Annual physical exam   [facility] Kathrine Haddock, NP   1 year ago Macrocytosis without anemia   [place], NP   1 year ago Right otitis media with effusion   [facility],  Salley Hews, PA-C

## 2019-03-02 NOTE — Telephone Encounter (Signed)
Patient cancelled 02/09/19 appointment.

## 2019-03-02 NOTE — Telephone Encounter (Signed)
Needs appointment

## 2019-03-03 ENCOUNTER — Encounter: Payer: Self-pay | Admitting: Family Medicine

## 2019-03-03 NOTE — Telephone Encounter (Signed)
LVM for pt to call back also sent letter through [facility] and mail.

## 2019-03-03 NOTE — Telephone Encounter (Signed)
Appt scheduled for 03/06/19.

## 2019-03-06 ENCOUNTER — Other Ambulatory Visit: Payer: Self-pay

## 2019-03-06 ENCOUNTER — Encounter: Payer: Self-pay | Admitting: Nurse Practitioner

## 2019-03-06 ENCOUNTER — Ambulatory Visit (INDEPENDENT_AMBULATORY_CARE_PROVIDER_SITE_OTHER): Payer: BLUE CROSS/BLUE SHIELD | Admitting: Nurse Practitioner

## 2019-03-06 DIAGNOSIS — E782 Mixed hyperlipidemia: Secondary | ICD-10-CM | POA: Diagnosis not present

## 2019-03-06 DIAGNOSIS — A6000 Herpesviral infection of urogenital system, unspecified: Secondary | ICD-10-CM | POA: Diagnosis not present

## 2019-03-06 DIAGNOSIS — I10 Essential (primary) hypertension: Secondary | ICD-10-CM

## 2019-03-06 DIAGNOSIS — F325 Major depressive disorder, single episode, in full remission: Secondary | ICD-10-CM

## 2019-03-06 MED ORDER — FLUOXETINE HCL 20 MG PO CAPS: ORAL_CAPSULE | ORAL | 3 refills | Status: DC

## 2019-03-06 MED ORDER — LOSARTAN POTASSIUM-HCTZ 100-25 MG PO TABS: 1.0000 | ORAL_TABLET | Freq: Every day | ORAL | 3 refills | Status: DC

## 2019-03-06 MED ORDER — VALACYCLOVIR HCL 500 MG PO TABS: 500.0000 mg | ORAL_TABLET | Freq: Every day | ORAL | 3 refills | Status: DC

## 2019-03-06 NOTE — Assessment & Plan Note (Signed)
Chronic, stable with BP at home in goal range.  Reports she does get white coat syndrome in office.  Continue current medication regimen and adjust as needed.  Last labs in June 2020, will plan to follow-up in 6 months for annual physical with labs.  Refills sent.

## 2019-03-06 NOTE — Progress Notes (Signed)
Lvm to make 6 month physical will send letter.

## 2019-03-06 NOTE — Assessment & Plan Note (Signed)
Chronic, stable with ASCVD 4.8%.  Continue focus on diet regimen at home.   Last labs in June 2020, will plan to follow-up in 6 months for annual physical with labs.

## 2019-03-06 NOTE — Progress Notes (Signed)
LMP 01/16/2010 (Approximate)    Subjective:    Patient ID: Jo Martinez, female    DOB: 06-30-1960, 59 y.o.   MRN: [id number]  HPI: Jo Martinez is a 59 y.o. female  Chief Complaint  Patient presents with  . Anxiety  . Hypertension    . This visit was completed via FaceTime due to the restrictions of the COVID-19 pandemic. All issues as above were discussed and addressed. Physical exam was done as above through visual confirmation on FaceTime. If it was felt that the patient should be evaluated in the office, they were directed there. The patient verbally consented to this visit. . Location of the patient: home . Location of the provider: home . Those involved with this call:  . Provider: Aura Dials, DNP . CMA: Wilhemena Durie, CMA . Front Desk/Registration: Adela Ports  . Time spent on call: 15 minutes with patient face to face via video conference. More than 50% of this time was spent in counseling and coordination of care. 10 minutes total spent in review of patient's record and preparation of their chart.  . I verified patient identity using two factors (patient name and date of birth). Patient consents verbally to being seen via telemedicine visit today.    HYPERTENSION / HYPERLIPIDEMIA Continues on Losartan-HCTZ.   Satisfied with current treatment? yes Duration of hypertension: chronic BP monitoring frequency: daily BP range:  133/82 last night and 80 pulse, this is often average for her at home BP medication side effects: no Duration of hyperlipidemia: chronic Cholesterol medication side effects: no Cholesterol supplements: none Medication compliance: good compliance Aspirin: no Recent stressors: no Recurrent headaches: no Visual changes: no Palpitations: no Dyspnea: no Chest pain: no Lower extremity edema: no Dizzy/lightheaded: no  The 10-year ASCVD risk score Denman George DC Jr., et al., 2013) is: 4.8%   Values used to calculate the score:     Age:  61 years     Sex: Female     Is Non-Hispanic African American: No     Diabetic: No     Tobacco smoker: No     Systolic Blood Pressure: 143 mmHg     Is BP treated: Yes     HDL Cholesterol: 50 mg/dL     Total Cholesterol: 239 mg/dL  DEPRESSION Has been on Prozac for years with good control. Duration:stable Anxious mood: no  Excessive worrying: no Irritability: no  Sweating: no Nausea: no Palpitations:no Hyperventilation: no Panic attacks: no Agoraphobia: no  Obscessions/compulsions: no Depressed mood: no Depression screen [facility] 2/9 03/06/2019 01/10/2018 09/25/2017 06/12/2017 12/18/2016  Decreased Interest 0 0 0 0 0  Down, Depressed, Hopeless 0 0 0 0 0  PHQ - 2 Score 0 0 0 0 0  Altered sleeping 0 0 0 0 0  Tired, decreased energy 0 0 0 0 0  Change in appetite 0 0 0 0 0  Feeling bad or failure about yourself  0 0 0 0 0  Trouble concentrating 0 0 0 0 0  Moving slowly or fidgety/restless 0 0 0 0 0  Suicidal thoughts 0 0 0 0 0  PHQ-9 Score 0 0 0 0 0  Difficult doing work/chores Not difficult at all - - - -   Anhedonia: no Weight changes: no Insomnia: none Hypersomnia: no Fatigue/loss of energy: no Feelings of worthlessness: no Feelings of guilt: no Impaired concentration/indecisiveness: no Suicidal ideations: no  Crying spells: no Recent Stressors/Life Changes: no   Relationship problems: no   Family stress: no  Financial stress: no    Job stress: no    Recent death/loss: no GAD 7 : Generalized Anxiety Score 03/06/2019  Nervous, Anxious, on Edge 0  Control/stop worrying 0  Worry too much - different things 0  Trouble relaxing 0  Restless 0  Easily annoyed or irritable 0  Afraid - awful might happen 0  Total GAD 7 Score 0  Anxiety Difficulty Not difficult at all    HERPES: Continues on Valtrex, with no outbreak in 15-20 years. Is in a monogamous marriage.  Relevant past medical, surgical, family and social history reviewed and updated as indicated. Interim  medical history since our last visit reviewed. Allergies and medications reviewed and updated.  Review of Systems  Constitutional: Negative for activity change, appetite change, diaphoresis, fatigue and fever.  Respiratory: Negative for cough, chest tightness and shortness of breath.   Cardiovascular: Negative for chest pain, palpitations and leg swelling.  Gastrointestinal: Negative.   Neurological: Negative.   Psychiatric/Behavioral: Negative.     Per HPI unless specifically indicated above     Objective:    LMP 01/16/2010 (Approximate)   Wt Readings from Last 3 Encounters:  07/31/18 165 lb (74.8 kg)  01/10/18 174 lb 9.6 oz (79.2 kg)  09/25/17 171 lb 12.8 oz (77.9 kg)    Physical Exam Vitals and nursing note reviewed.  Constitutional:      General: She is awake. She is not in acute distress.    Appearance: She is well-developed. She is not ill-appearing.  HENT:     Head: Normocephalic.     Right Ear: Hearing normal.     Left Ear: Hearing normal.  Eyes:     General: Lids are normal.        Right eye: No discharge.        Left eye: No discharge.     Conjunctiva/sclera: Conjunctivae normal.  Pulmonary:     Effort: Pulmonary effort is normal. No accessory muscle usage or respiratory distress.  Musculoskeletal:     Cervical back: Normal range of motion.  Neurological:     Mental Status: She is alert and oriented to person, place, and time.  Psychiatric:        Attention and Perception: Attention normal.        Mood and Affect: Mood normal.        Behavior: Behavior normal. Behavior is cooperative.        Thought Content: Thought content normal.        Judgment: Judgment normal.     Results for orders placed or performed in visit on 07/31/18  Microscopic Examination   URINE  Result Value Ref Range   WBC, UA 0-5 0 - 5 /hpf   RBC 0-2 0 - 2 /hpf   Epithelial Cells (non renal) 0-10 0 - 10 /hpf   Bacteria, UA Moderate (A) None seen/Few  Urine Culture, Reflex    URINE  Result Value Ref Range   Urine Culture, Routine Final report    Organism ID, Bacteria Comment   CBC with Differential/Platelet  Result Value Ref Range   WBC 8.5 3.4 - 10.8 x10E3/uL   RBC 4.58 3.77 - 5.28 x10E6/uL   Hemoglobin 17.0 (H) 11.1 - 15.9 g/dL   Hematocrit 23.3 (H) 00.7 - 46.6 %   MCV 104 (H) 79 - 97 fL   MCH 37.1 (H) 26.6 - 33.0 pg   MCHC 35.9 (H) 31.5 - 35.7 g/dL   RDW 62.2 63.3 - 35.4 %   Platelets  261 150 - 450 x10E3/uL   Neutrophils 56 Not Estab. %   Lymphs 35 Not Estab. %   Monocytes 6 Not Estab. %   Eos 2 Not Estab. %   Basos 1 Not Estab. %   Neutrophils Absolute 4.8 1.4 - 7.0 x10E3/uL   Lymphocytes Absolute 3.0 0.7 - 3.1 x10E3/uL   Monocytes Absolute 0.5 0.1 - 0.9 x10E3/uL   EOS (ABSOLUTE) 0.1 0.0 - 0.4 x10E3/uL   Basophils Absolute 0.0 0.0 - 0.2 x10E3/uL   Immature Granulocytes 0 Not Estab. %   Immature Grans (Abs) 0.0 0.0 - 0.1 x10E3/uL  Bayer DCA Hb A1c Waived  Result Value Ref Range   HB A1C (BAYER DCA - WAIVED) 5.0 <7.0 %  Comprehensive metabolic panel  Result Value Ref Range   Glucose 113 (H) 65 - 99 mg/dL   BUN 10 6 - 24 mg/dL   Creatinine, Ser 6.43 0.57 - 1.00 mg/dL   GFR calc non Af Amer 69 >59 mL/min/1.73   GFR calc Af Amer 79 >59 mL/min/1.73   BUN/Creatinine Ratio 11 9 - 23   Sodium 141 134 - 144 mmol/L   Potassium 4.1 3.5 - 5.2 mmol/L   Chloride 102 96 - 106 mmol/L   CO2 24 20 - 29 mmol/L   Calcium 10.1 8.7 - 10.2 mg/dL   Total Protein 6.9 6.0 - 8.5 g/dL   Albumin 4.5 3.8 - 4.9 g/dL   Globulin, Total 2.4 1.5 - 4.5 g/dL   Albumin/Globulin Ratio 1.9 1.2 - 2.2   Bilirubin Total 0.4 0.0 - 1.2 mg/dL   Alkaline Phosphatase 76 39 - 117 IU/L   AST 48 (H) 0 - 40 IU/L   ALT 33 (H) 0 - 32 IU/L  Lipid Panel w/o Chol/HDL Ratio  Result Value Ref Range   Cholesterol, Total 239 (H) 100 - 199 mg/dL   Triglycerides 329 (H) 0 - 149 mg/dL   HDL 50 >51 mg/dL   VLDL Cholesterol Cal 45 (H) 5 - 40 mg/dL   LDL Calculated 884 (H) 0 - 99 mg/dL  TSH   Result Value Ref Range   TSH 4.280 0.450 - 4.500 uIU/mL  UA/M w/rflx Culture, Routine   Specimen: Urine   URINE  Result Value Ref Range   Specific Gravity, UA 1.010 1.005 - 1.030   pH, UA 7.0 5.0 - 7.5   Color, UA Yellow Yellow   Appearance Ur Hazy (A) Clear   Leukocytes,UA Negative Negative   Protein,UA 1+ (A) Negative/Trace   Glucose, UA Negative Negative   Ketones, UA Negative Negative   RBC, UA Trace (A) Negative   Bilirubin, UA Negative Negative   Urobilinogen, Ur 0.2 0.2 - 1.0 mg/dL   Nitrite, UA Negative Negative   Microscopic Examination See below:    Urinalysis Reflex Comment       Assessment & Plan:   Problem List Items Addressed This Visit      Cardiovascular and Mediastinum   Hypertension    Chronic, stable with BP at home in goal range.  Reports she does get white coat syndrome in office.  Continue current medication regimen and adjust as needed.  Last labs in June 2020, will plan to follow-up in 6 months for annual physical with labs.  Refills sent.      Relevant Medications   losartan-hydrochlorothiazide (HYZAAR) 100-25 MG tablet     Genitourinary   Genital herpes    Stable with no outbreaks in several years.  Refills sent.  Relevant Medications   valACYclovir (VALTREX) 500 MG tablet     Other   Hyperlipidemia    Chronic, stable with ASCVD 4.8%.  Continue focus on diet regimen at home.   Last labs in June 2020, will plan to follow-up in 6 months for annual physical with labs.        Relevant Medications   losartan-hydrochlorothiazide (HYZAAR) 100-25 MG tablet   Major depressive disorder, single episode, in remission (HCC)    Chronic, stable.  Denies SI/HI.  Continue current regimen and adjust as needed.  Refills sent.      Relevant Medications   FLUoxetine (PROZAC) 20 MG capsule      I discussed the assessment and treatment plan with the patient. The patient was provided an opportunity to ask questions and all were answered. The patient  agreed with the plan and demonstrated an understanding of the instructions.   The patient was advised to call back or seek an in-person evaluation if the symptoms worsen or if the condition fails to improve as anticipated.   I provided 15 minutes of time during this encounter.  Follow up plan: Return in about 6 months (around 09/03/2019) for Annual physical.

## 2019-03-06 NOTE — Patient Instructions (Signed)
DASH Eating Plan DASH stands for "Dietary Approaches to Stop Hypertension." The DASH eating plan is a healthy eating plan that has been shown to reduce high blood pressure (hypertension). It may also reduce your risk for type 2 diabetes, heart disease, and stroke. The DASH eating plan may also help with weight loss. What are tips for following this plan?  General guidelines  Avoid eating more than 2,300 mg (milligrams) of salt (sodium) a day. If you have hypertension, you may need to reduce your sodium intake to 1,500 mg a day.  Limit alcohol intake to no more than 1 drink a day for nonpregnant women and 2 drinks a day for men. One drink equals 12 oz of beer, 5 oz of wine, or 1 oz of hard liquor.  Work with your health care provider to maintain a healthy body weight or to lose weight. Ask what an ideal weight is for you.  Get at least 30 minutes of exercise that causes your heart to beat faster (aerobic exercise) most days of the week. Activities may include walking, swimming, or biking.  Work with your health care provider or diet and nutrition specialist (dietitian) to adjust your eating plan to your individual calorie needs. Reading food labels   Check food labels for the amount of sodium per serving. Choose foods with less than 5 percent of the Daily Value of sodium. Generally, foods with less than 300 mg of sodium per serving fit into this eating plan.  To find whole grains, look for the word "whole" as the first word in the ingredient list. Shopping  Buy products labeled as "low-sodium" or "no salt added."  Buy fresh foods. Avoid canned foods and premade or frozen meals. Cooking  Avoid adding salt when cooking. Use salt-free seasonings or herbs instead of table salt or sea salt. Check with your health care provider or pharmacist before using salt substitutes.  Do not fry foods. Cook foods using healthy methods such as baking, boiling, grilling, and broiling instead.  Cook with  heart-healthy oils, such as olive, canola, soybean, or sunflower oil. Meal planning  Eat a balanced diet that includes: ? 5 or more servings of fruits and vegetables each day. At each meal, try to fill half of your plate with fruits and vegetables. ? Up to 6-8 servings of whole grains each day. ? Less than 6 oz of lean meat, poultry, or fish each day. A 3-oz serving of meat is about the same size as a deck of cards. One egg equals 1 oz. ? 2 servings of low-fat dairy each day. ? A serving of nuts, seeds, or beans 5 times each week. ? Heart-healthy fats. Healthy fats called Omega-3 fatty acids are found in foods such as flaxseeds and coldwater fish, like sardines, salmon, and mackerel.  Limit how much you eat of the following: ? Canned or prepackaged foods. ? Food that is high in trans fat, such as fried foods. ? Food that is high in saturated fat, such as fatty meat. ? Sweets, desserts, sugary drinks, and other foods with added sugar. ? Full-fat dairy products.  Do not salt foods before eating.  Try to eat at least 2 vegetarian meals each week.  Eat more home-cooked food and less restaurant, buffet, and fast food.  When eating at a restaurant, ask that your food be prepared with less salt or no salt, if possible. What foods are recommended? The items listed may not be a complete list. Talk with your dietitian about   what dietary choices are best for you. Grains Whole-grain or whole-wheat bread. Whole-grain or whole-wheat pasta. Brown rice. Oatmeal. Quinoa. Bulgur. Whole-grain and low-sodium cereals. Pita bread. Low-fat, low-sodium crackers. Whole-wheat flour tortillas. Vegetables Fresh or frozen vegetables (raw, steamed, roasted, or grilled). Low-sodium or reduced-sodium tomato and vegetable juice. Low-sodium or reduced-sodium tomato sauce and tomato paste. Low-sodium or reduced-sodium canned vegetables. Fruits All fresh, dried, or frozen fruit. Canned fruit in natural juice (without  added sugar). Meat and other protein foods Skinless chicken or turkey. Ground chicken or turkey. Pork with fat trimmed off. Fish and seafood. Egg whites. Dried beans, peas, or lentils. Unsalted nuts, nut butters, and seeds. Unsalted canned beans. Lean cuts of beef with fat trimmed off. Low-sodium, lean deli meat. Dairy Low-fat (1%) or fat-free (skim) milk. Fat-free, low-fat, or reduced-fat cheeses. Nonfat, low-sodium ricotta or cottage cheese. Low-fat or nonfat yogurt. Low-fat, low-sodium cheese. Fats and oils Soft margarine without trans fats. Vegetable oil. Low-fat, reduced-fat, or light mayonnaise and salad dressings (reduced-sodium). Canola, safflower, olive, soybean, and sunflower oils. Avocado. Seasoning and other foods Herbs. Spices. Seasoning mixes without salt. Unsalted popcorn and pretzels. Fat-free sweets. What foods are not recommended? The items listed may not be a complete list. Talk with your dietitian about what dietary choices are best for you. Grains Baked goods made with fat, such as croissants, muffins, or some breads. Dry pasta or rice meal packs. Vegetables Creamed or fried vegetables. Vegetables in a cheese sauce. Regular canned vegetables (not low-sodium or reduced-sodium). Regular canned tomato sauce and paste (not low-sodium or reduced-sodium). Regular tomato and vegetable juice (not low-sodium or reduced-sodium). Pickles. Olives. Fruits Canned fruit in a light or heavy syrup. Fried fruit. Fruit in cream or butter sauce. Meat and other protein foods Fatty cuts of meat. Ribs. Fried meat. Bacon. Sausage. Bologna and other processed lunch meats. Salami. Fatback. Hotdogs. Bratwurst. Salted nuts and seeds. Canned beans with added salt. Canned or smoked fish. Whole eggs or egg yolks. Chicken or turkey with skin. Dairy Whole or 2% milk, cream, and half-and-half. Whole or full-fat cream cheese. Whole-fat or sweetened yogurt. Full-fat cheese. Nondairy creamers. Whipped toppings.  Processed cheese and cheese spreads. Fats and oils Butter. Stick margarine. Lard. Shortening. Ghee. Bacon fat. Tropical oils, such as coconut, palm kernel, or palm oil. Seasoning and other foods Salted popcorn and pretzels. Onion salt, garlic salt, seasoned salt, table salt, and sea salt. Worcestershire sauce. Tartar sauce. Barbecue sauce. Teriyaki sauce. Soy sauce, including reduced-sodium. Steak sauce. Canned and packaged gravies. Fish sauce. Oyster sauce. Cocktail sauce. Horseradish that you find on the shelf. Ketchup. Mustard. Meat flavorings and tenderizers. Bouillon cubes. Hot sauce and Tabasco sauce. Premade or packaged marinades. Premade or packaged taco seasonings. Relishes. Regular salad dressings. Where to find more information:  National Heart, Lung, and Blood Institute: www.nhlbi.nih.gov  American Heart Association: www.heart.org Summary  The DASH eating plan is a healthy eating plan that has been shown to reduce high blood pressure (hypertension). It may also reduce your risk for type 2 diabetes, heart disease, and stroke.  With the DASH eating plan, you should limit salt (sodium) intake to 2,300 mg a day. If you have hypertension, you may need to reduce your sodium intake to 1,500 mg a day.  When on the DASH eating plan, aim to eat more fresh fruits and vegetables, whole grains, lean proteins, low-fat dairy, and heart-healthy fats.  Work with your health care provider or diet and nutrition specialist (dietitian) to adjust your eating plan to your   individual calorie needs. This information is not intended to replace advice given to you by your health care provider. Make sure you discuss any questions you have with your health care provider. Document Revised: 01/25/2017 Document Reviewed: 02/06/2016 Elsevier Patient Education  2020 Elsevier Inc.  

## 2019-03-06 NOTE — Assessment & Plan Note (Signed)
Stable with no outbreaks in several years.  Refills sent.

## 2019-03-06 NOTE — Assessment & Plan Note (Signed)
Chronic, stable.  Denies SI/HI.  Continue current regimen and adjust as needed.  Refills sent. °

## 2019-03-07 IMAGING — CR DG LUMBAR SPINE COMPLETE 4+V
1 series · 5 of 5 positions shown · non-contrast
Comparison: None.

CLINICAL DATA: Low back pain

EXAM:
LUMBAR SPINE - COMPLETE 4+ VIEW

[Series 1: dg lumbar spine complete 4 +v · 0.14mm/px · 5 of 5 slices shown]
[im 1/5]
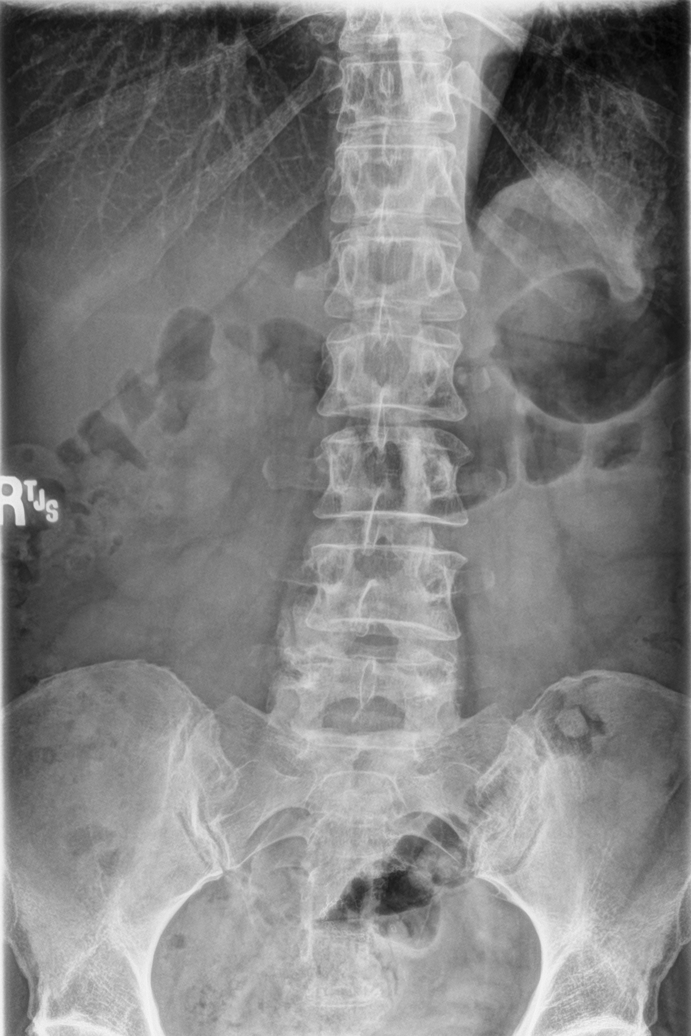
[im 2/5]
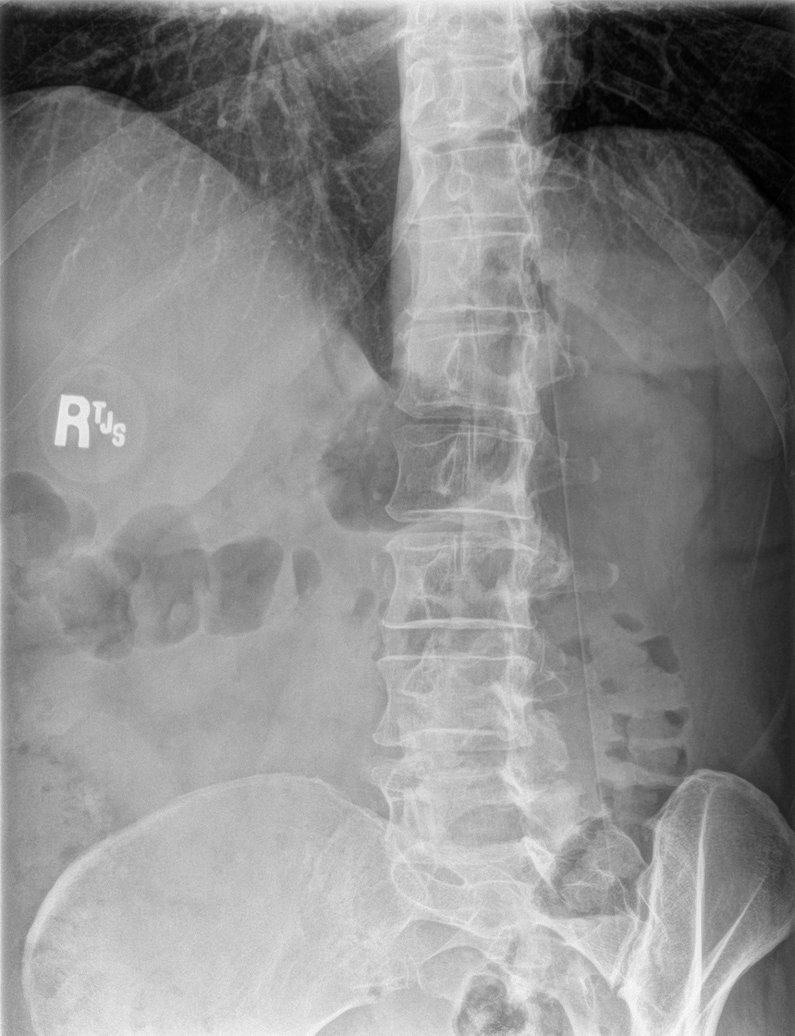
[im 3/5]
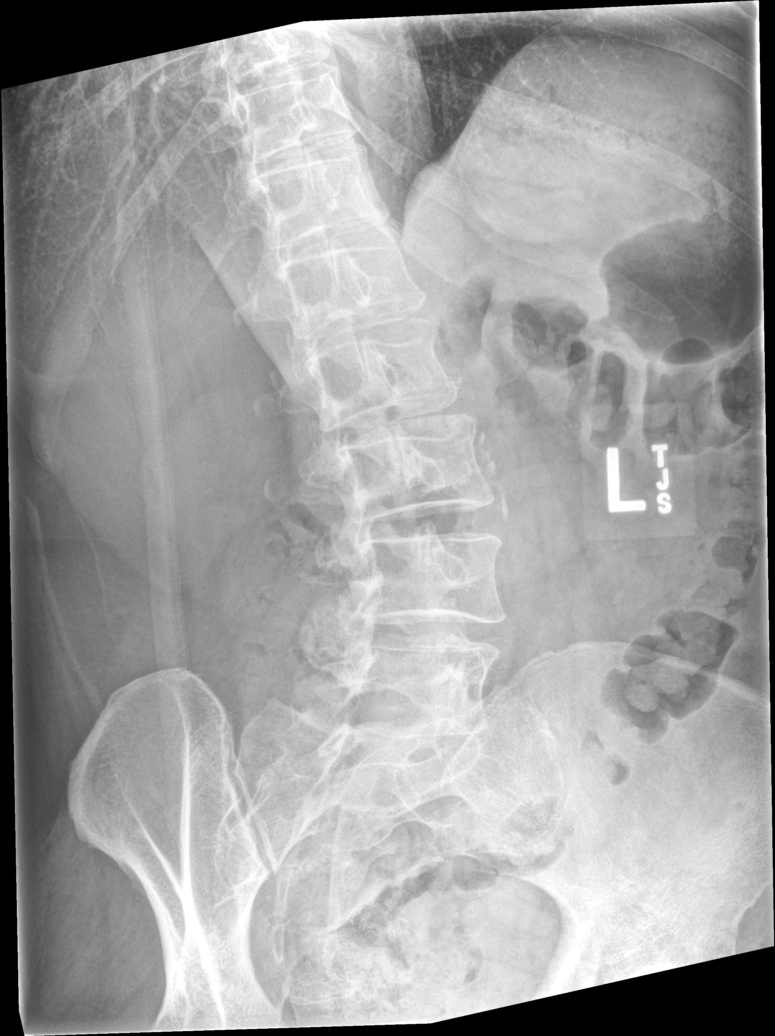
[im 4/5]
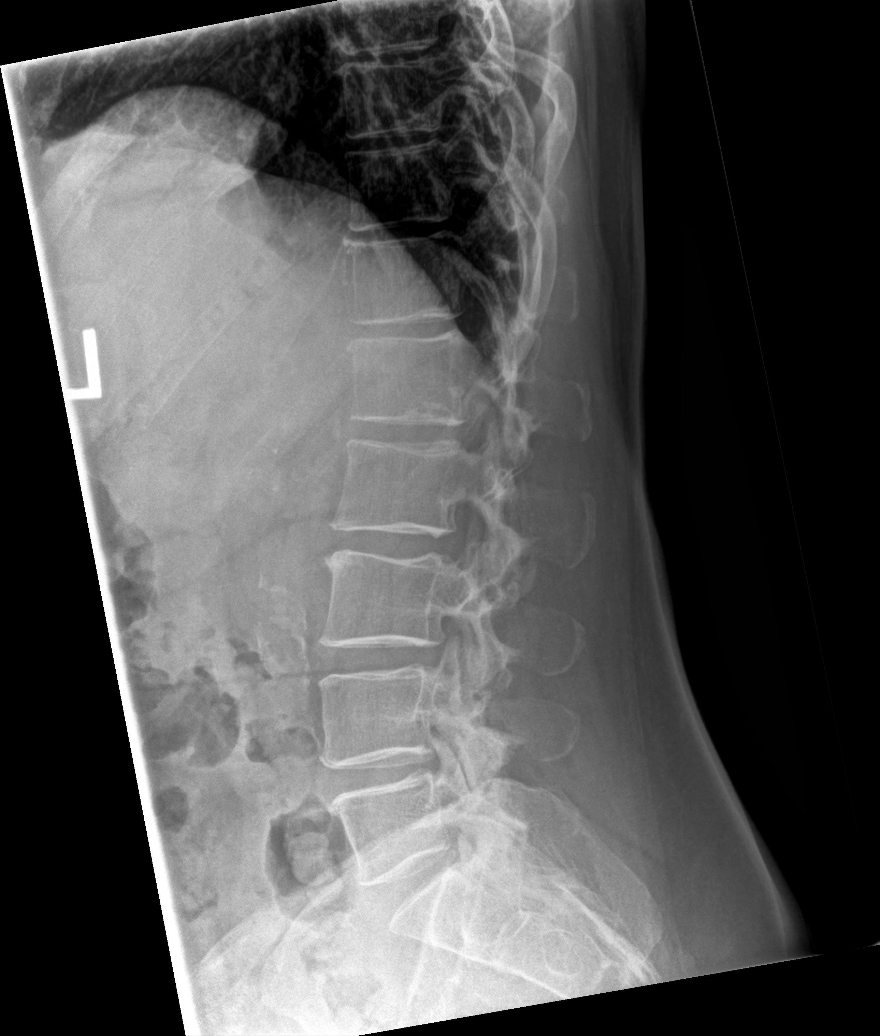
[im 5/5]
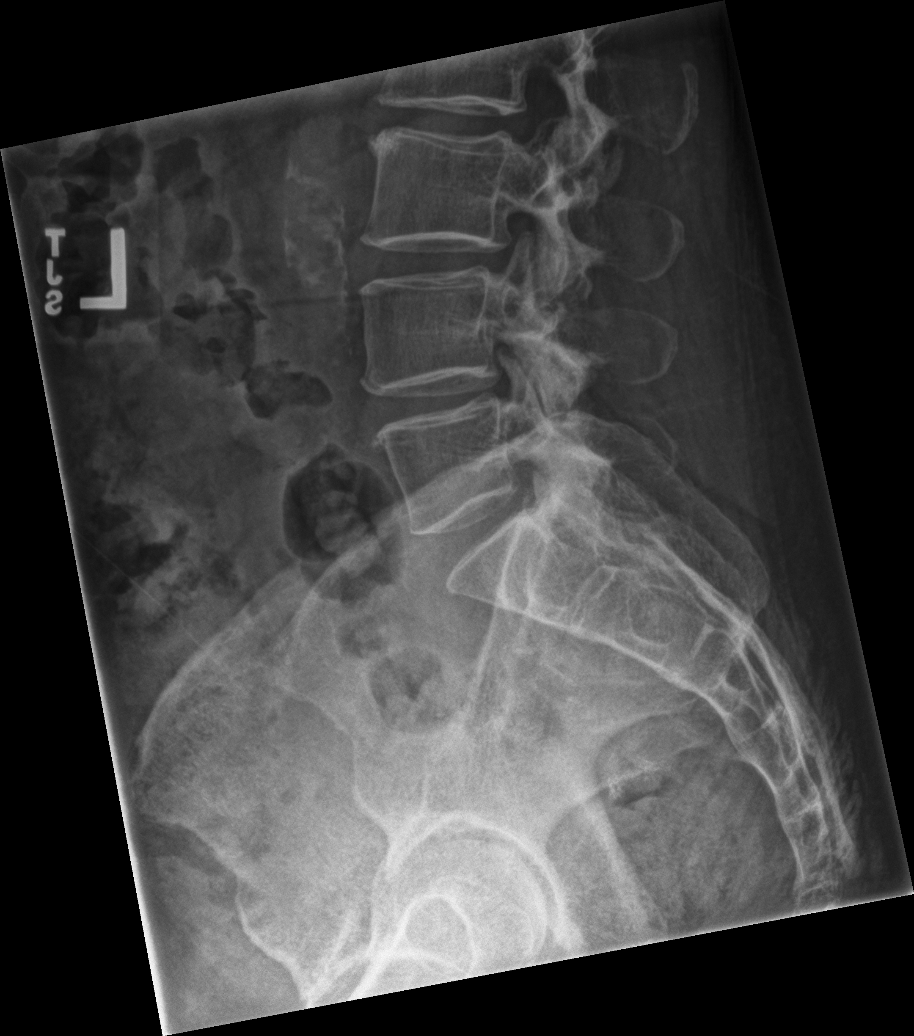

[5 of 5 positions shown; findings below may reference images not displayed]

FINDINGS: Five lumbar type vertebral bodies are well visualized. Vertebral
body height is well maintained. No anterolisthesis is noted. Mild
osteophytic changes and facet hypertrophic changes are seen. No soft
tissue abnormality is noted.
IMPRESSION: Degenerative change without acute abnormality.

## 2019-03-10 ENCOUNTER — Encounter: Payer: Self-pay | Admitting: Nurse Practitioner

## 2019-03-19 ENCOUNTER — Other Ambulatory Visit: Payer: Self-pay | Admitting: Chiropractic Medicine

## 2019-03-19 ENCOUNTER — Ambulatory Visit
Admission: RE | Admit: 2019-03-19 | Discharge: 2019-03-19 | Disposition: A | Discharge status: Home / Self Care | Payer: BLUE CROSS/BLUE SHIELD | Source: Ambulatory Visit | Attending: Chiropractic Medicine | Admitting: Chiropractic Medicine

## 2019-03-19 ENCOUNTER — Other Ambulatory Visit: Payer: Self-pay

## 2019-03-19 DIAGNOSIS — M461 Sacroiliitis, not elsewhere classified: Secondary | ICD-10-CM | POA: Insufficient documentation

## 2019-03-19 DIAGNOSIS — M9903 Segmental and somatic dysfunction of lumbar region: Secondary | ICD-10-CM | POA: Insufficient documentation

## 2019-03-19 DIAGNOSIS — M545 Low back pain, unspecified: Secondary | ICD-10-CM

## 2019-03-19 DIAGNOSIS — M9904 Segmental and somatic dysfunction of sacral region: Secondary | ICD-10-CM | POA: Diagnosis present

## 2019-08-04 ENCOUNTER — Encounter: Payer: BLUE CROSS/BLUE SHIELD | Admitting: Nurse Practitioner

## 2019-11-24 ENCOUNTER — Other Ambulatory Visit: Payer: Self-pay | Admitting: Nurse Practitioner

## 2019-11-27 ENCOUNTER — Ambulatory Visit
Admission: RE | Admit: 2019-11-27 | Discharge: 2019-11-27 | Disposition: A | Discharge status: Home / Self Care | Payer: Disability Insurance | Source: Ambulatory Visit | Attending: Internal Medicine | Admitting: Internal Medicine

## 2019-11-27 ENCOUNTER — Other Ambulatory Visit: Payer: Self-pay | Admitting: Internal Medicine

## 2019-11-27 DIAGNOSIS — G629 Polyneuropathy, unspecified: Secondary | ICD-10-CM | POA: Diagnosis not present

## 2019-11-27 IMAGING — CR DG LUMBAR SPINE 2-3V
3 series · 3 of 3 positions shown · non-contrast
Comparison: [DATE] lumbar spine radiographs

CLINICAL DATA: Neuropathy with loss of sensation below the waist
for greater than 1 year. Weakness. No reported injury.

EXAM:
LUMBAR SPINE - 2-3 VIEW

[l-spine ap]
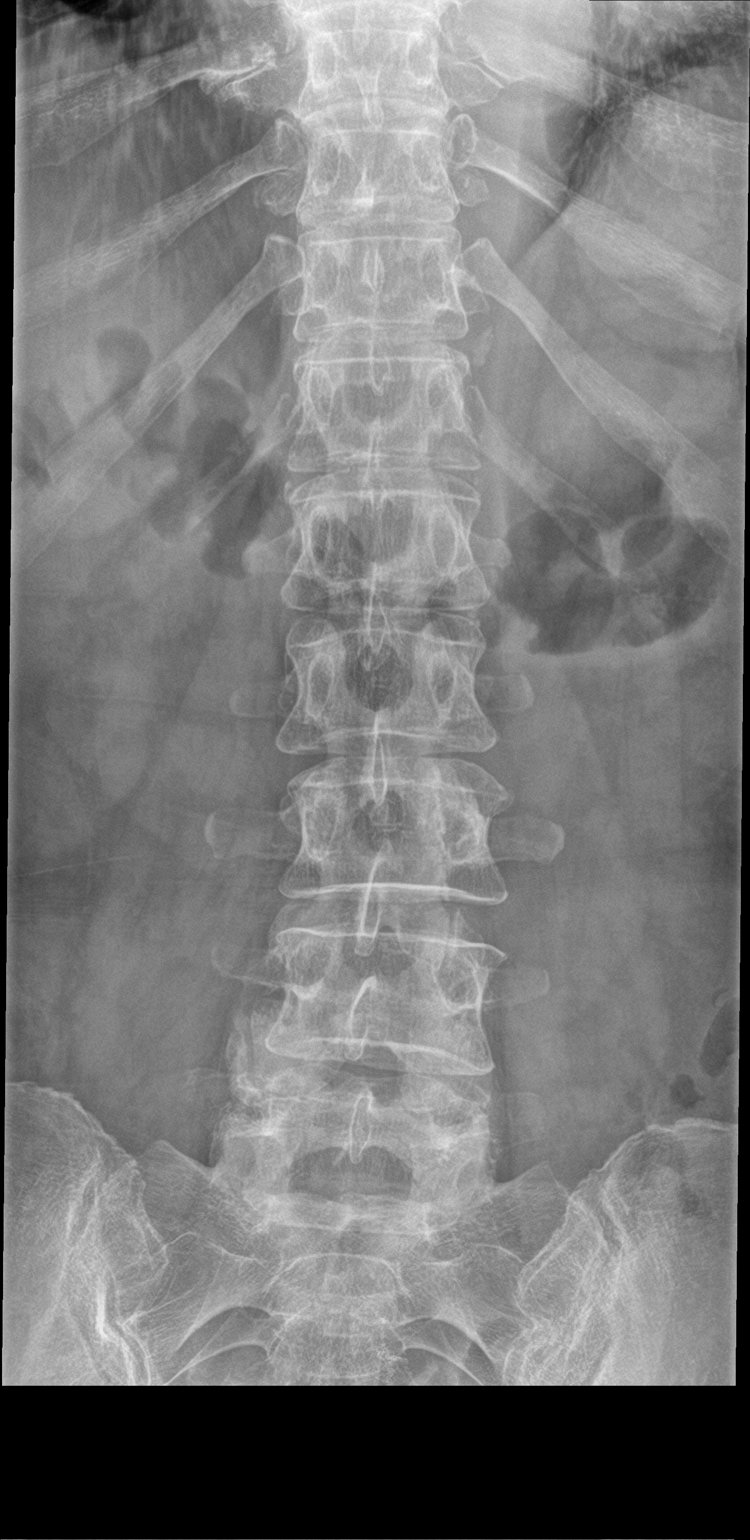

[l-spine lat]
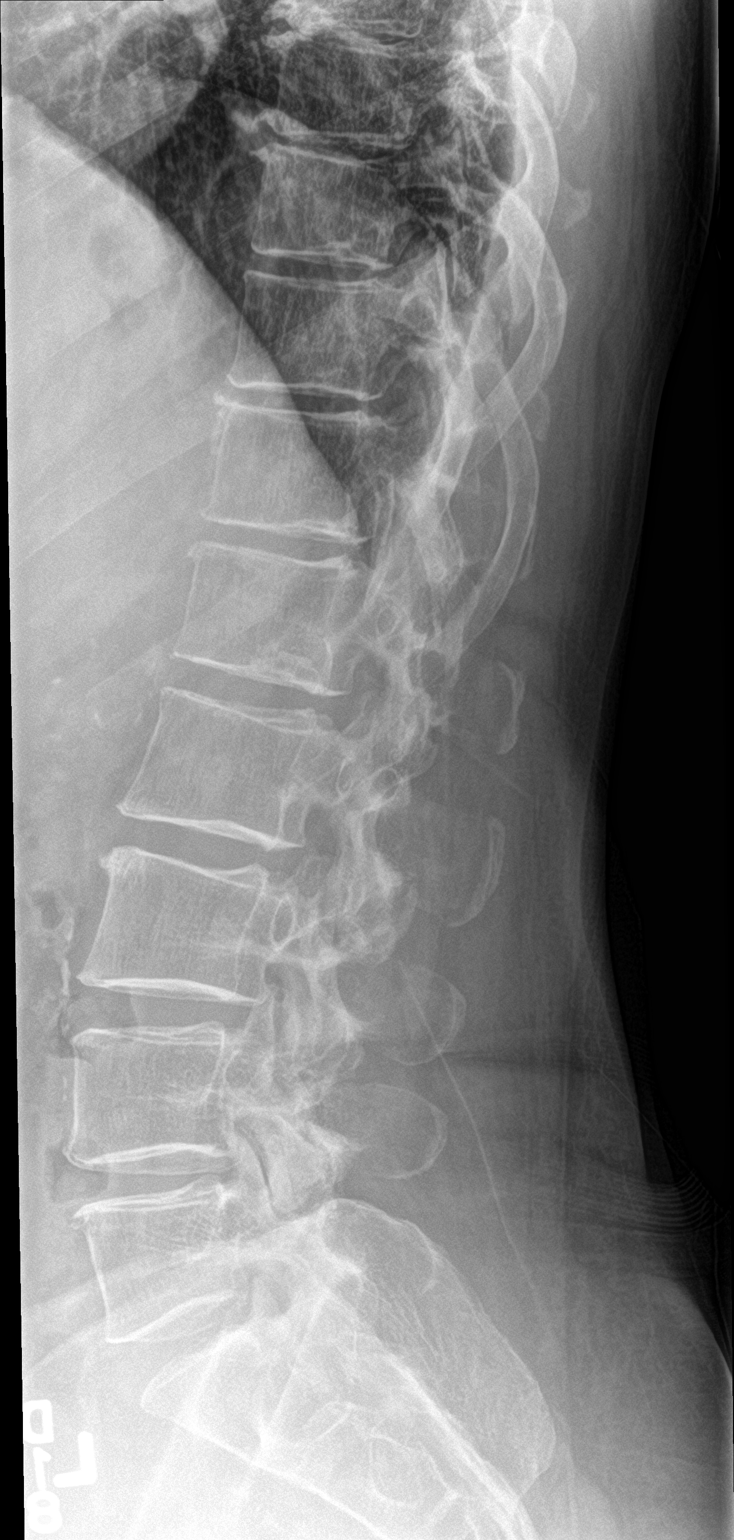

[l-spine spot]
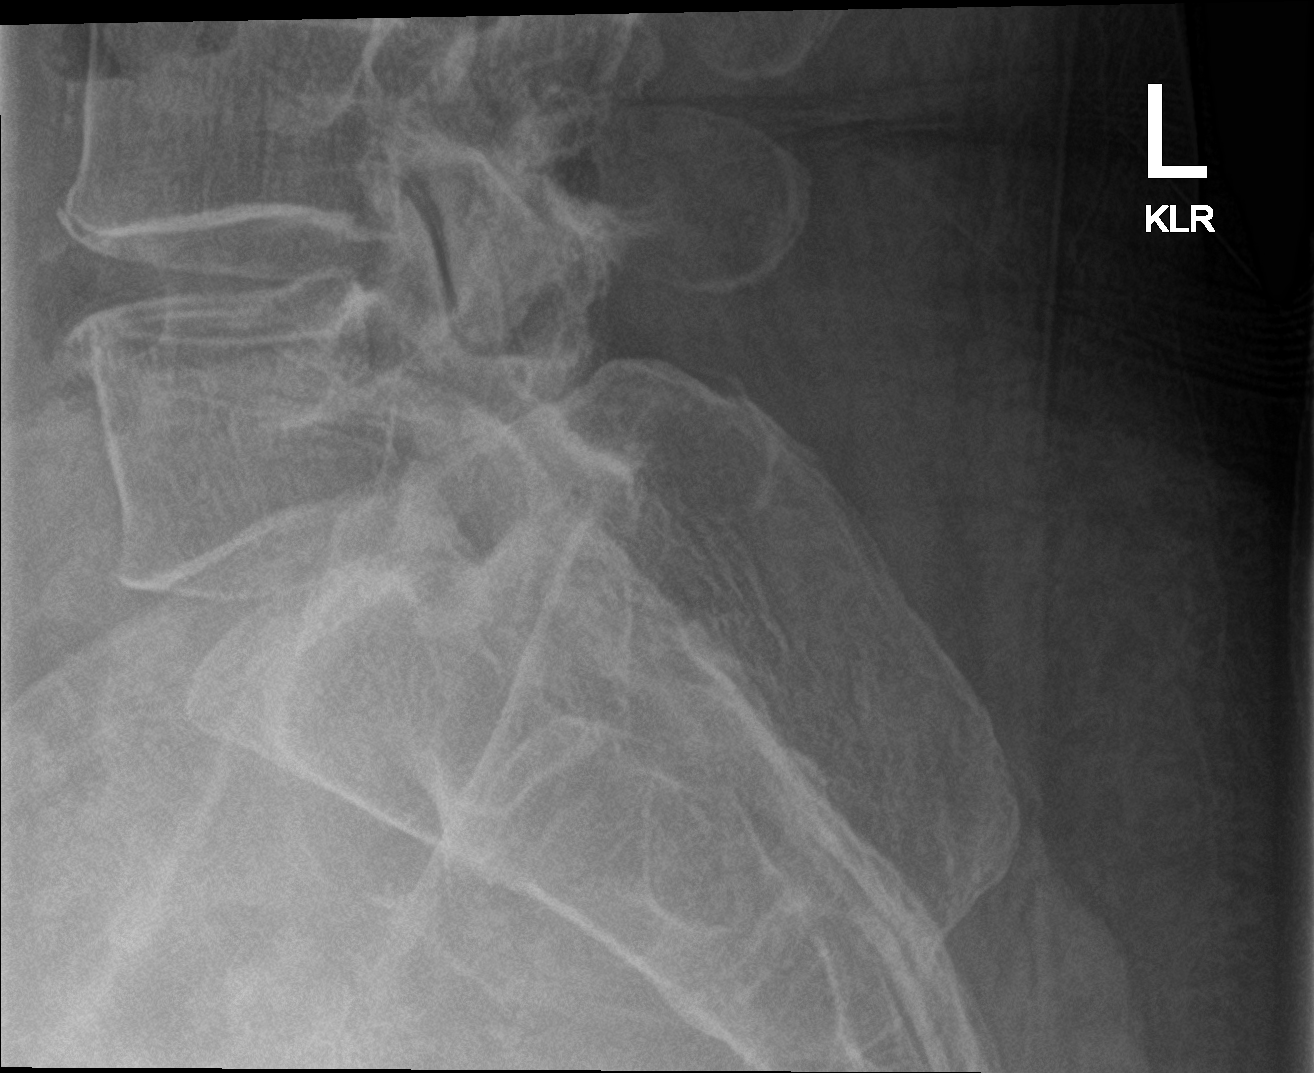

[3 of 3 positions shown; findings below may reference images not displayed]

FINDINGS: This report assumes 5 non rib-bearing lumbar vertebrae.

Lumbar vertebral body heights are preserved, with no fracture.

Mild multilevel lumbar degenerative disc disease, most prominent at
L4-5, unchanged. No spondylolisthesis. Mild lower lumbar facet
arthropathy bilaterally. No aggressive appearing focal osseous
lesions. Abdominal aortic atherosclerosis.
IMPRESSION: 1. Stable mild multilevel lumbar degenerative disc disease, most
prominent at L4-5.
2. Mild lower lumbar facet arthropathy bilaterally.
3. No acute osseous abnormality.

## 2019-11-27 IMAGING — CR DG KNEE COMPLETE 4+V*L*
4 series · 4 of 4 positions shown · non-contrast
Comparison: None.

CLINICAL DATA: Loss of sensation from waist down for over 1 year.
Weakness. Neuropathy. No reported injury.

EXAM:
LEFT KNEE - COMPLETE 4+ VIEW

[knee ap]
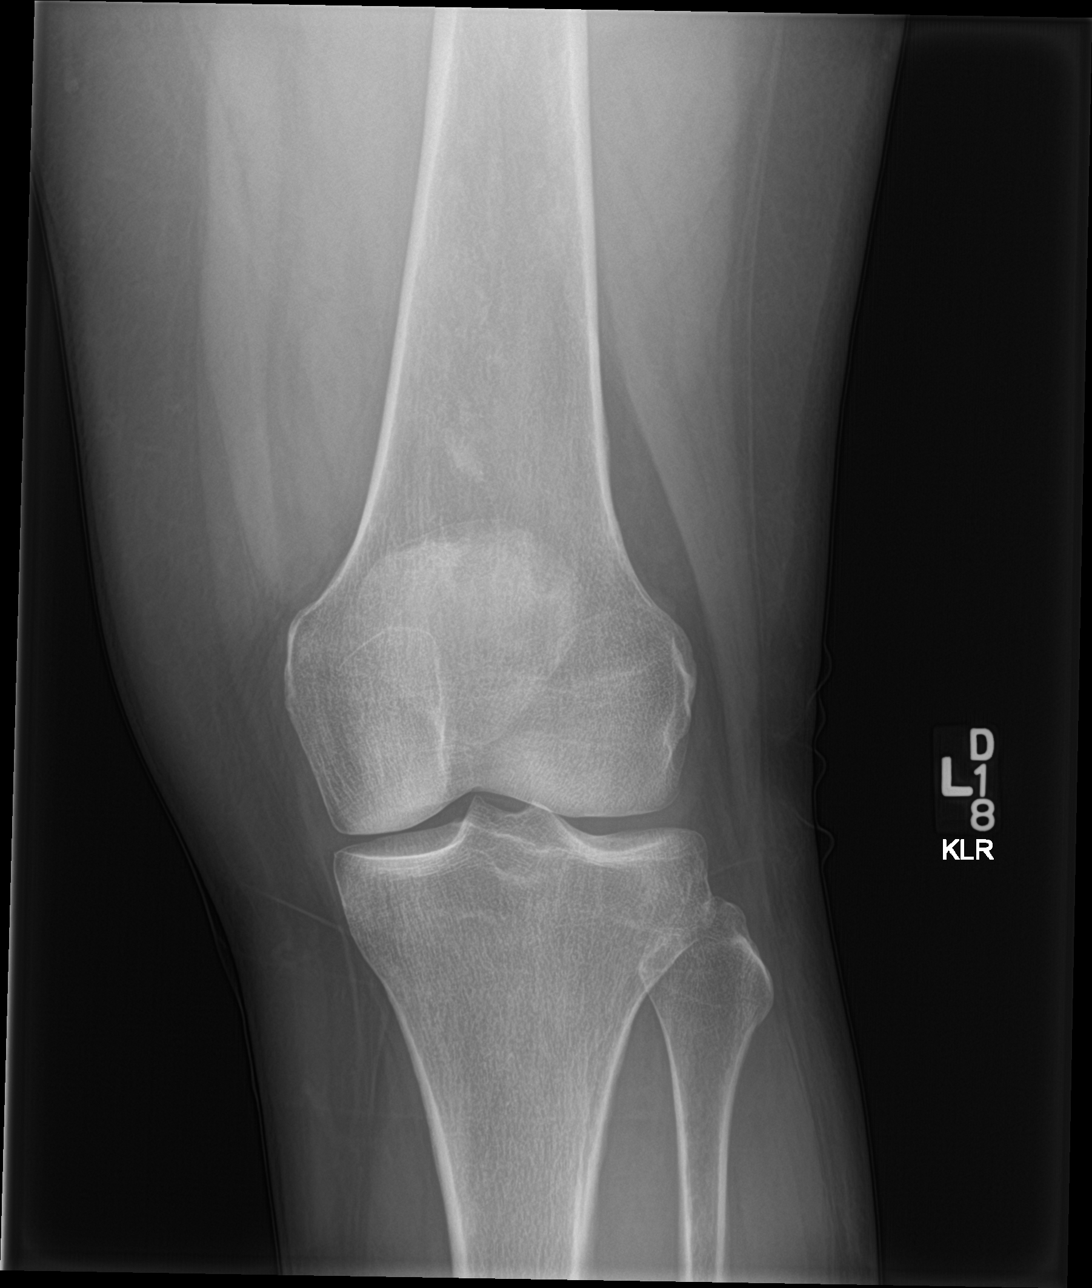

[knee obl]
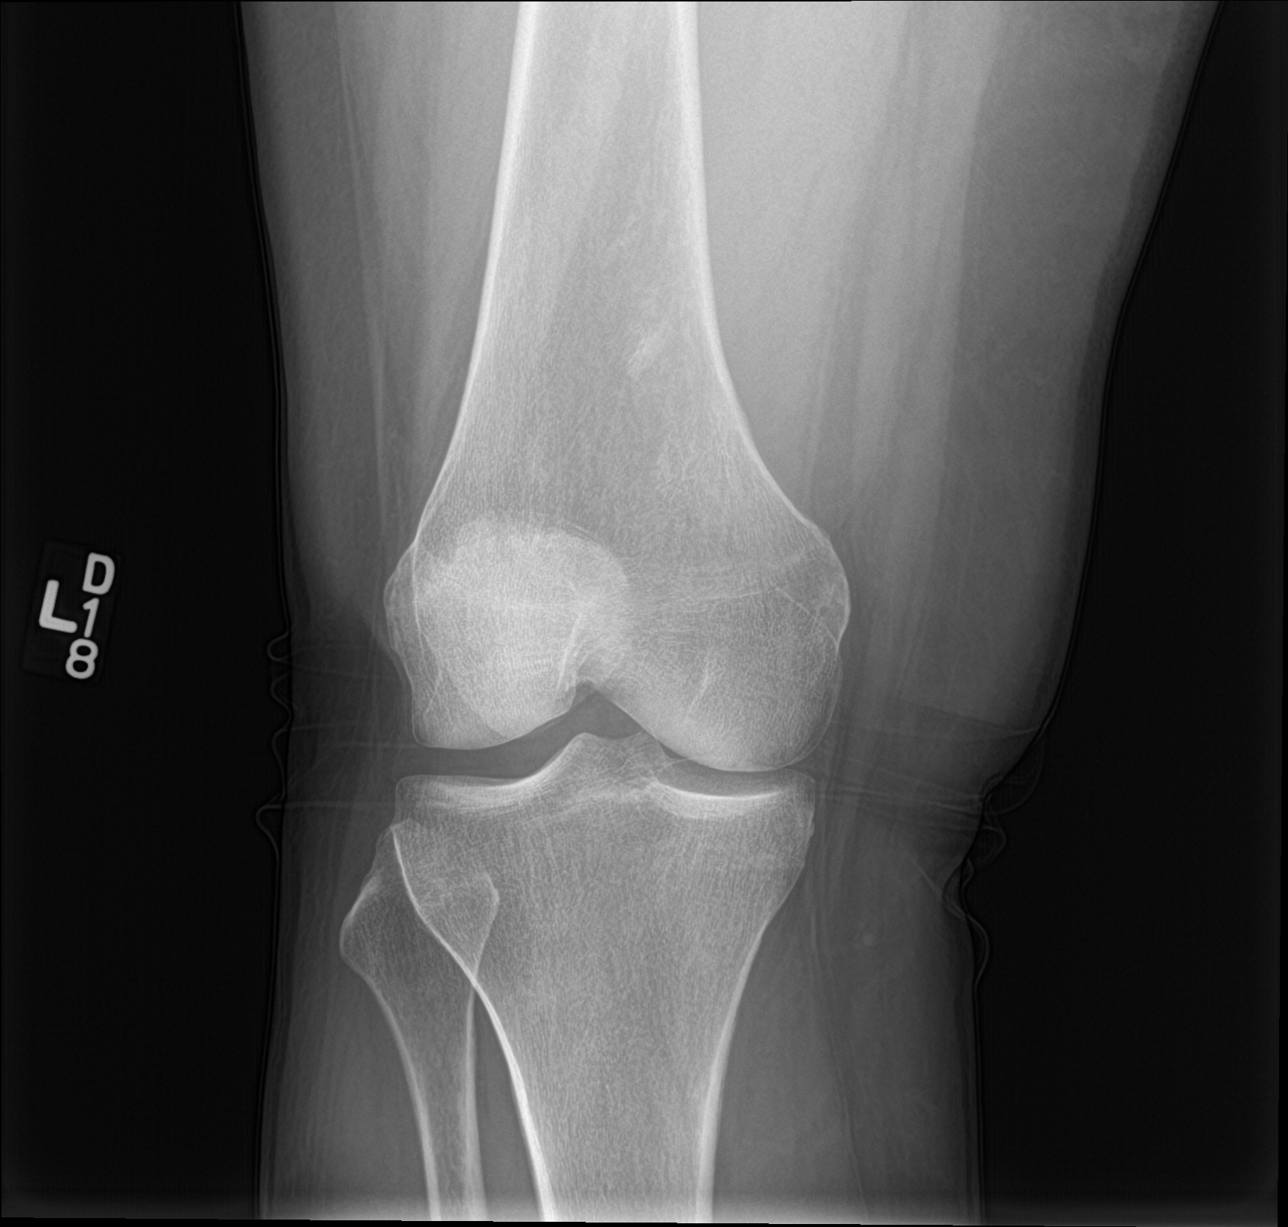

[knee lat]
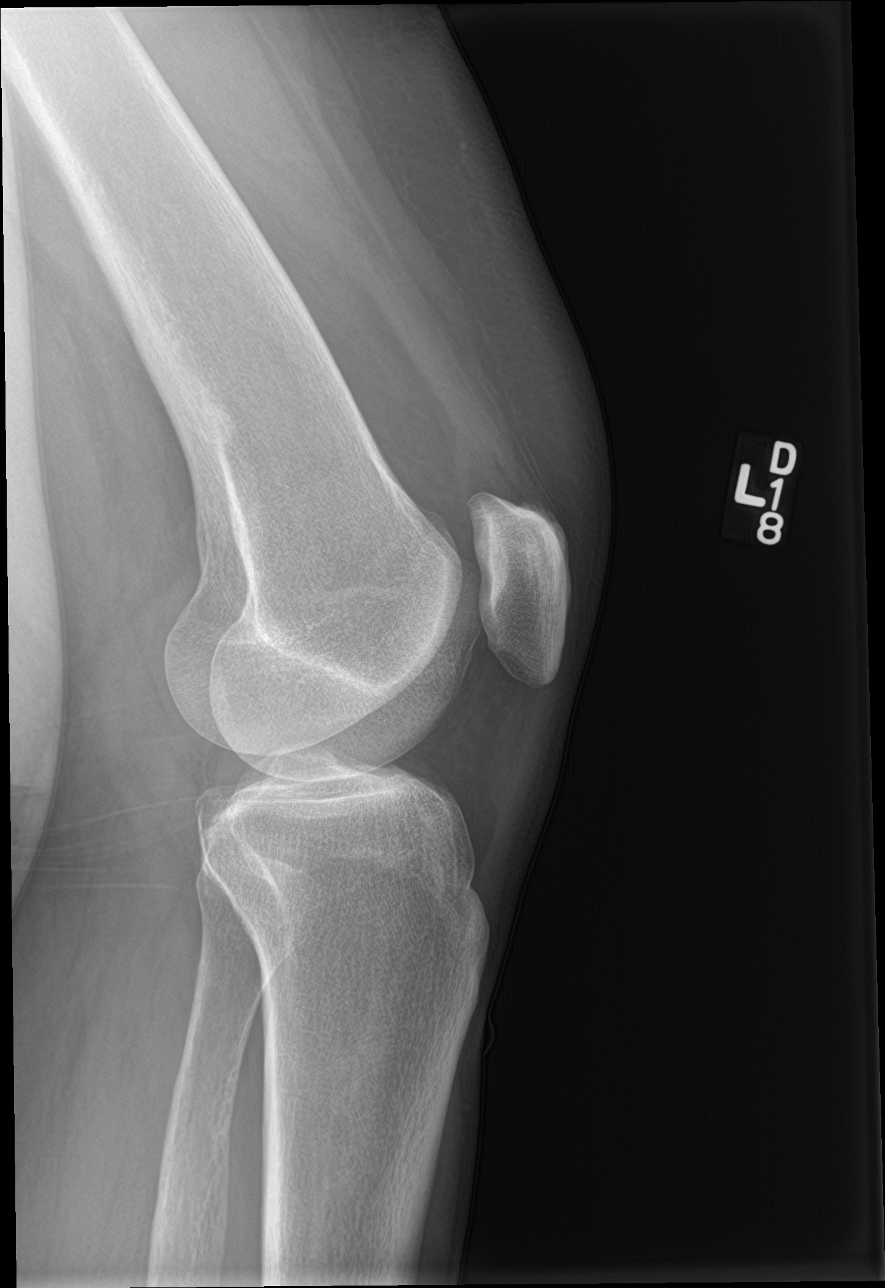

[sunrise]
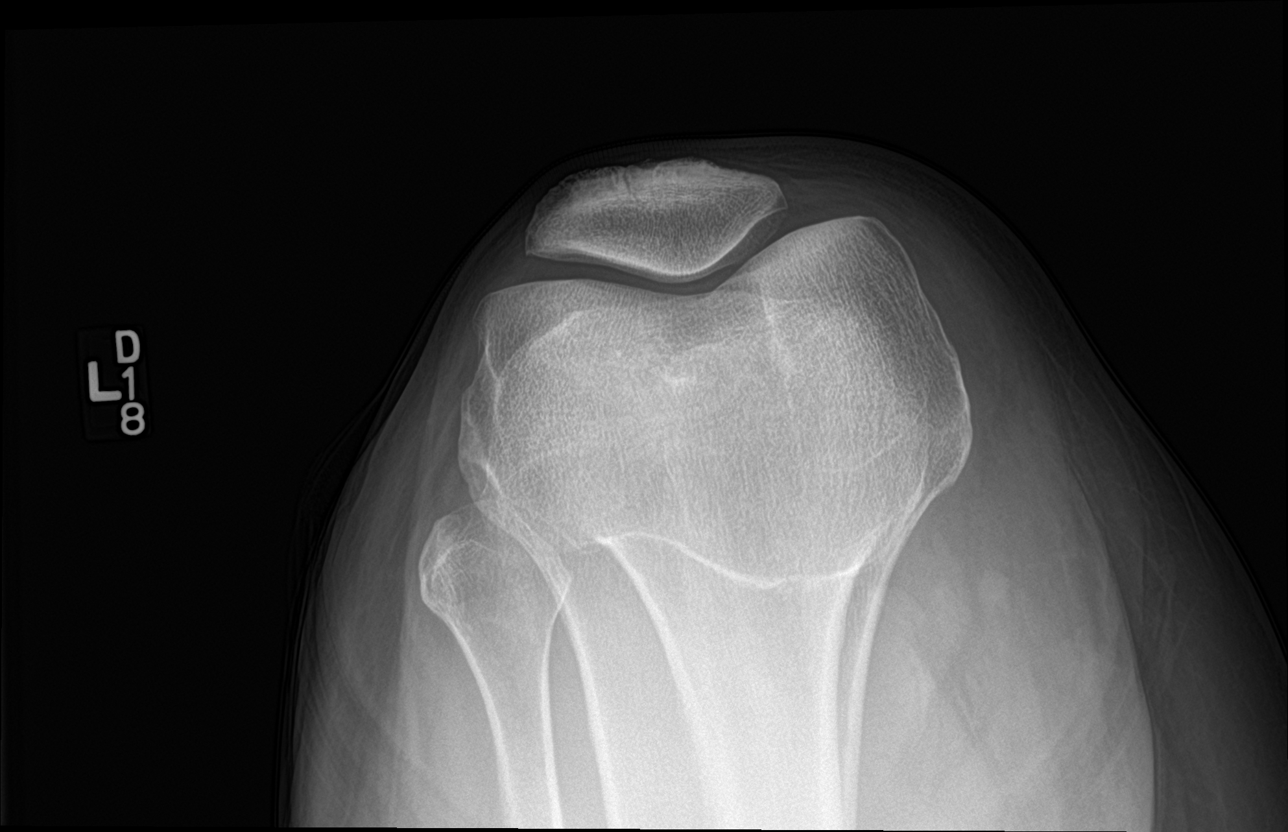

[4 of 4 positions shown; findings below may reference images not displayed]

FINDINGS: No fracture, joint effusion or dislocation. No suspicious focal
osseous lesion ends. Tiny superior left patellar enthesophyte. No
significant degenerative arthropathy. No radiopaque foreign body.
IMPRESSION: Tiny superior left patellar enthesophyte. Otherwise normal left knee
radiographs.

## 2019-11-27 IMAGING — CR DG KNEE COMPLETE 4+V*R*
4 series · 4 of 4 positions shown · non-contrast
Comparison: None.

CLINICAL DATA: Neuropathy with loss of sensation below the waist
for over 1 year. No reported injury. Weakness.

EXAM:
RIGHT KNEE - COMPLETE 4+ VIEW

[knee ap]
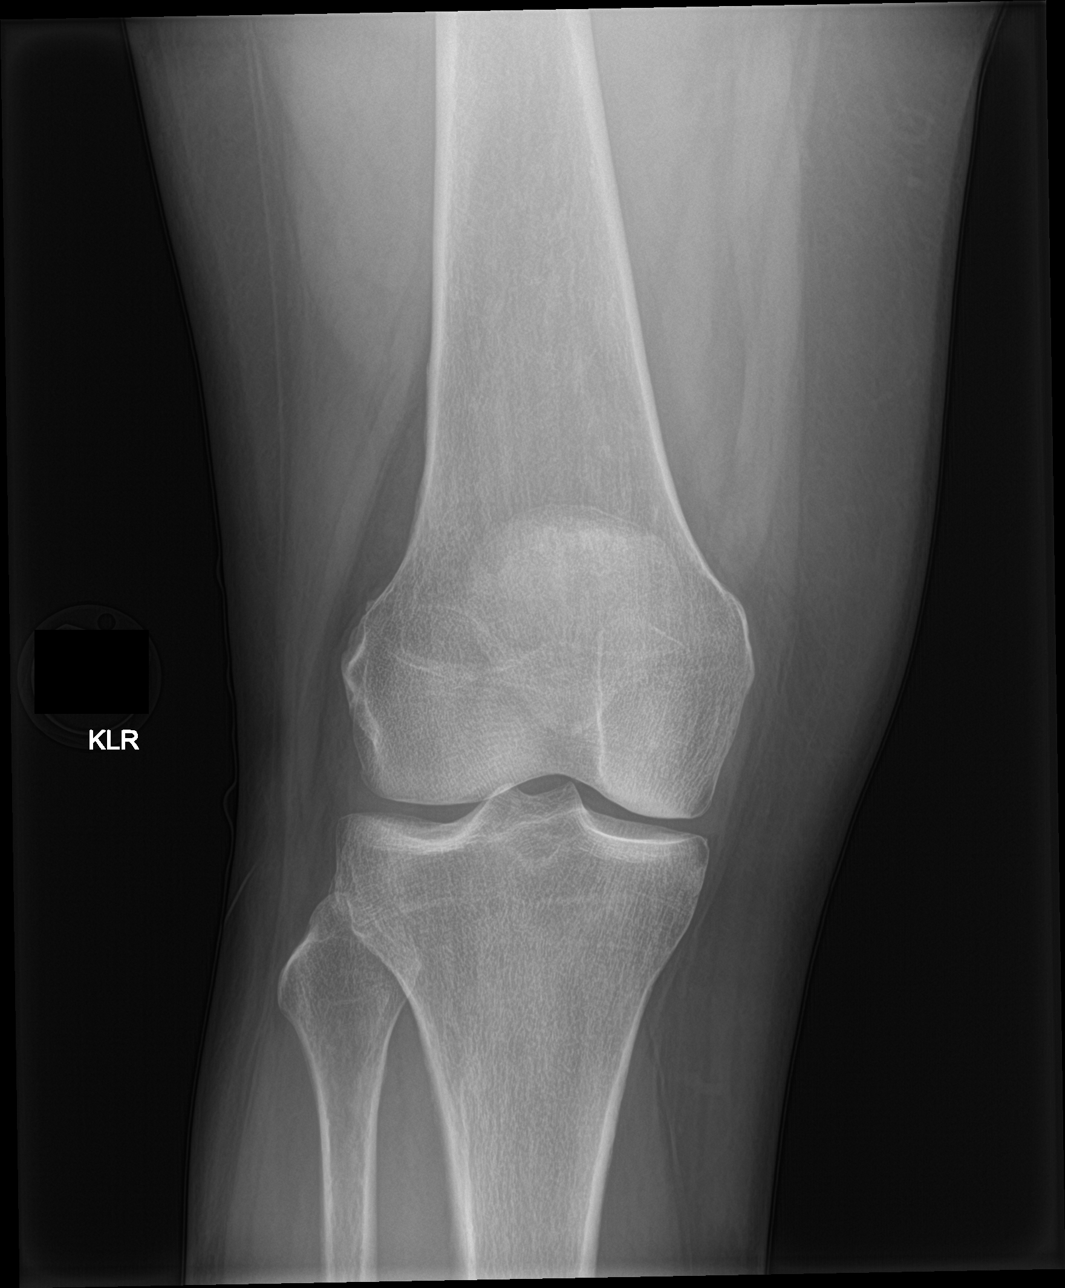

[knee obl]
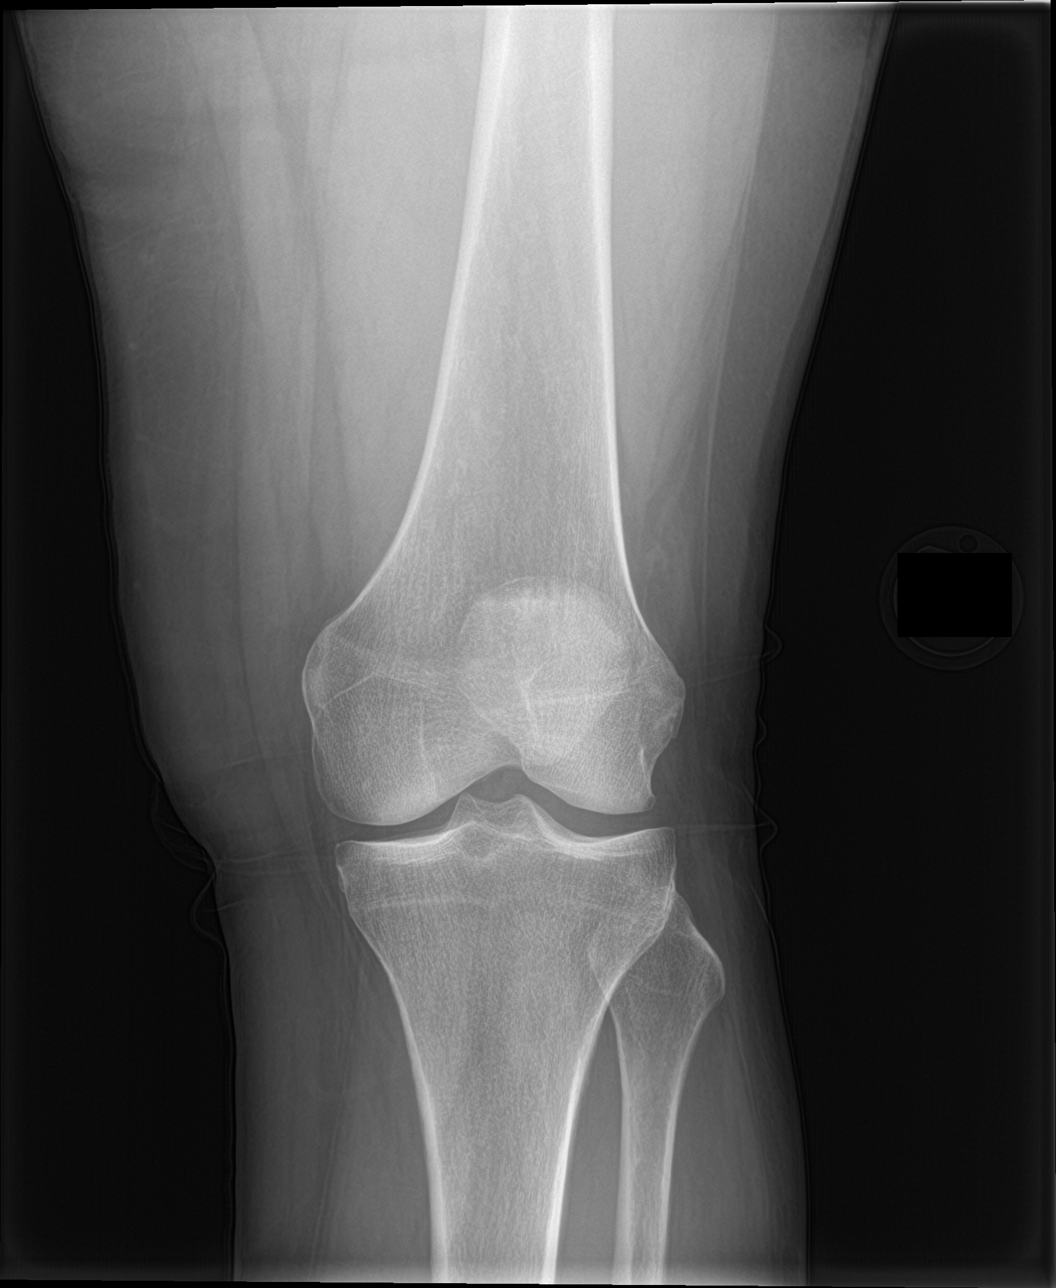

[knee lat]
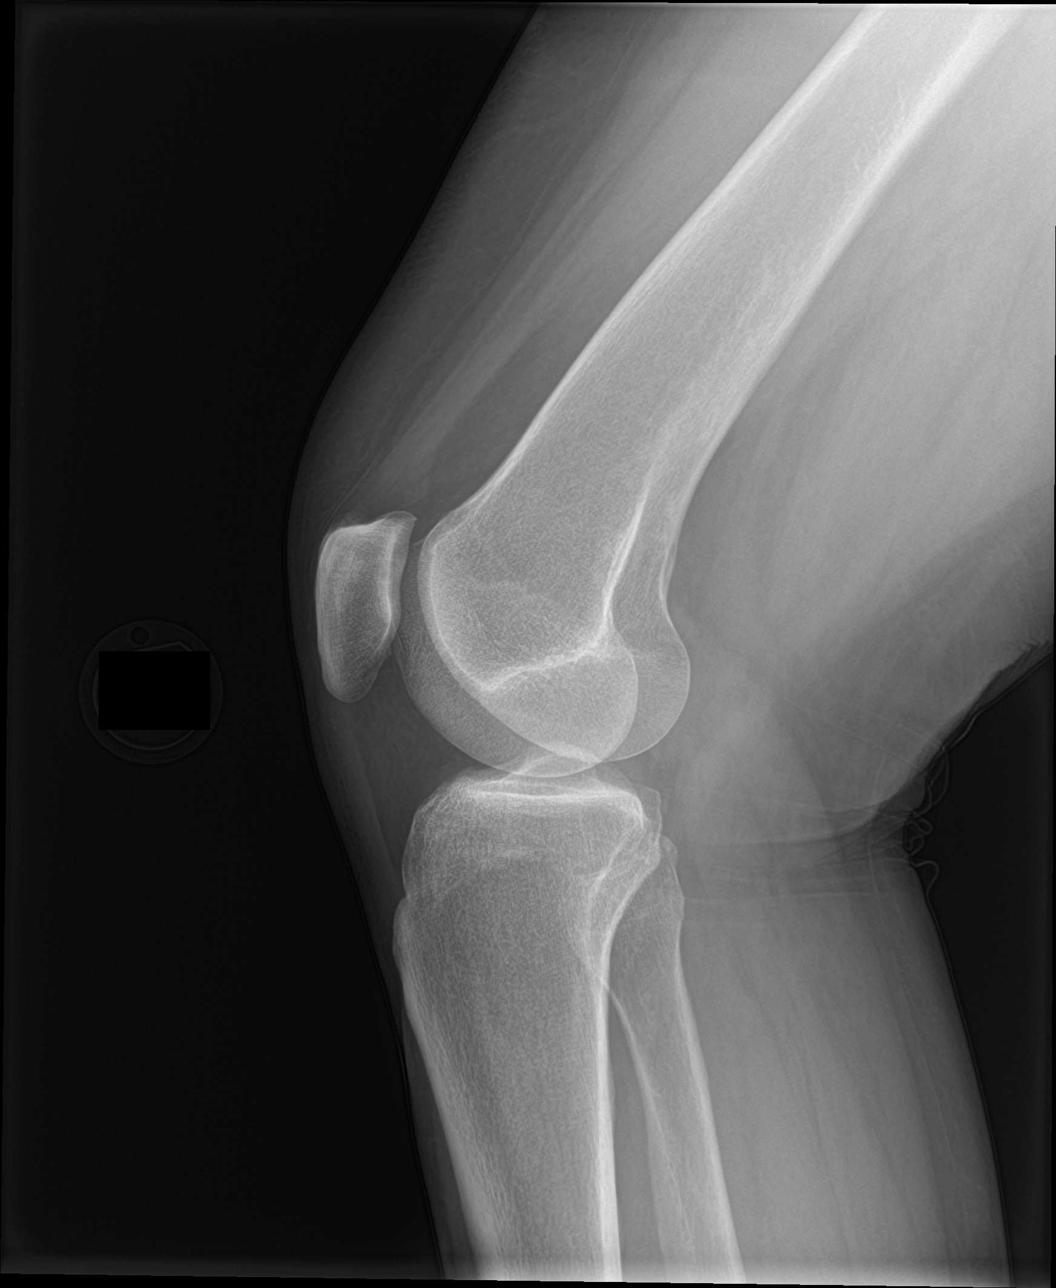

[sunrise]
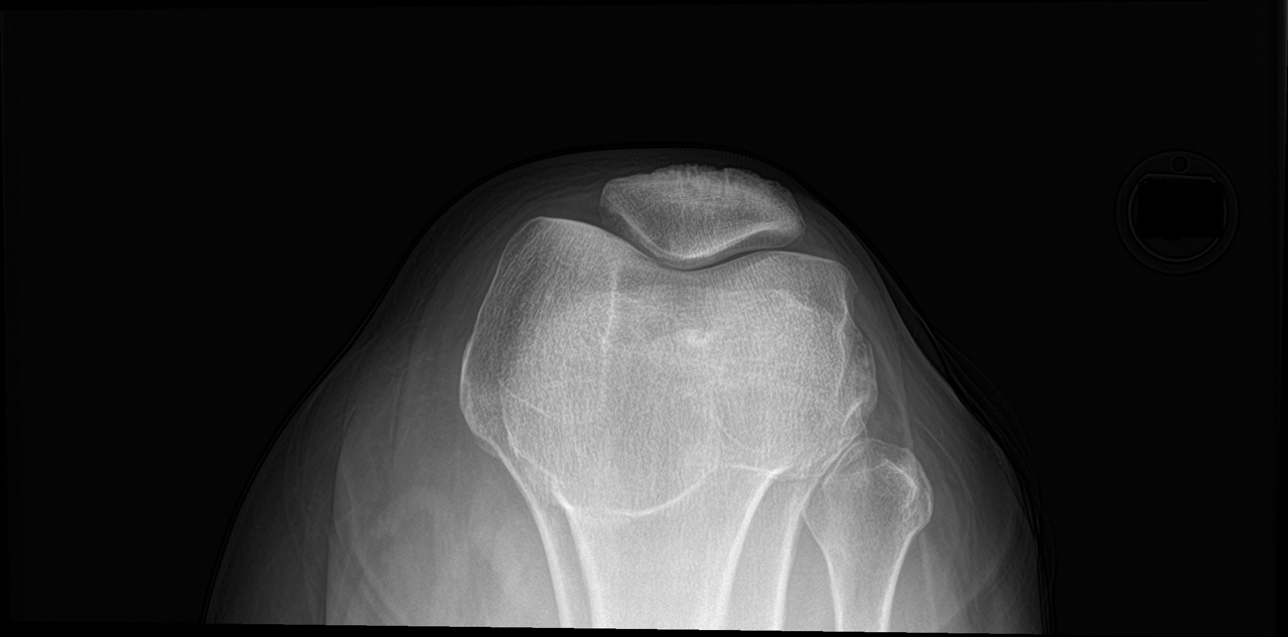

[4 of 4 positions shown; findings below may reference images not displayed]

FINDINGS: No fracture, joint effusion or dislocation. No suspicious focal
osseous lesions. Small superior right patellar enthesophyte. No
significant degenerative arthropathy. No radiopaque foreign bodies.
IMPRESSION: Small superior right patellar enthesophyte. Otherwise normal right
knee radiographs.

## 2019-12-22 ENCOUNTER — Encounter: Payer: Self-pay | Admitting: Nurse Practitioner

## 2019-12-22 ENCOUNTER — Other Ambulatory Visit: Payer: Self-pay

## 2019-12-22 ENCOUNTER — Ambulatory Visit: Payer: 59 | Admitting: Nurse Practitioner

## 2019-12-22 VITALS — BP 110/77 | HR 90 | Temp 97.8°F | Resp 16 | Ht 65.0 in | Wt 183.8 lb

## 2019-12-22 DIAGNOSIS — Z6827 Body mass index (BMI) 27.0-27.9, adult: Secondary | ICD-10-CM | POA: Insufficient documentation

## 2019-12-22 DIAGNOSIS — E782 Mixed hyperlipidemia: Secondary | ICD-10-CM | POA: Diagnosis not present

## 2019-12-22 DIAGNOSIS — E669 Obesity, unspecified: Secondary | ICD-10-CM | POA: Insufficient documentation

## 2019-12-22 DIAGNOSIS — Z6829 Body mass index (BMI) 29.0-29.9, adult: Secondary | ICD-10-CM | POA: Insufficient documentation

## 2019-12-22 DIAGNOSIS — Z6830 Body mass index (BMI) 30.0-30.9, adult: Secondary | ICD-10-CM | POA: Diagnosis not present

## 2019-12-22 DIAGNOSIS — E6609 Other obesity due to excess calories: Secondary | ICD-10-CM

## 2019-12-22 DIAGNOSIS — R200 Anesthesia of skin: Secondary | ICD-10-CM | POA: Diagnosis not present

## 2019-12-22 NOTE — Assessment & Plan Note (Signed)
Recommended eating smaller high protein, low fat meals more frequently and exercising 30 mins a day 5 times a week with a goal of 10-15lb weight loss in the next 3 months. Patient voiced their understanding and motivation to adhere to these recommendations.  

## 2019-12-22 NOTE — Assessment & Plan Note (Addendum)
Chronic, stable with ASCVD 3.1%.  Continue focus on diet regimen at home.   Last labs in June 2020, will obtain lipid panel today.

## 2019-12-22 NOTE — Progress Notes (Signed)
BP 110/77 (BP Location: Left Arm, Patient Position: Sitting, Cuff Size: Normal)   Pulse 90   Temp 97.8 F (36.6 C) (Oral)   Resp 16   Ht 5' 5"  (1.651 m)   Wt 183 lb 12.8 oz (83.4 kg)   LMP 01/16/2010 (Approximate)   SpO2 98%   BMI 30.59 kg/m    Subjective:    Patient ID: Jo Martinez, female    DOB: [birth year], 59 y.o.   MRN: [id number]  HPI: Jo Martinez is a 59 y.o. female  Chief Complaint  Patient presents with  . Leg Problem    numbness and weakness worsening in the last few months.   LEG NUMBNESS Reports ongoing issues for over one year, initially seen in office June 2020 for lower back pain.  Had recent lumbar spine imaging on 11/27/19 noting "stable mild multilevel lumbar degenerative disc disease, most prominent at L4-L5" + mild lower lumbar facet arthropathy bilaterally.   Last B12 check in 2019 -- 609.  Has tried acupuncture and chiropractor, who thinks it is neuropathy.  Tried over the counter herbal medications, compression boots, inversion table.  She reports issue has progressively become worse, now from waist down.  Worse if tries to walk too long or stand too long.  She has noticed numbness in fingertips too.  Mother had issues with neuropathy.  She does not want to start medication at this time, until further testing and underlying cause assessed.  Does have history of rash years ago to right lower back, which she thinks was tick, but reports dermatology did biopsy and told her eczema. Duration: over one year Pain: numbness -- tingling and burning in toes, legs numbness and heaviness Severity: 8/10 Quality:  aching, burning and throbbing Location: lower legs and upper thighs Bilateral:  yes Onset: gradual Frequency: constant Time of  day:   all day Sudden unintentional leg jerking:   no Paresthesias:   yes Decreased sensation:  yes Weakness:   no Insomnia:  Baseline for her Fatigue:   no Alleviating factors: nothing Aggravating factors: walking too  long or standing too long Status: worse Treatments attempted: chiropractor and acupuncturist The 10-year ASCVD risk score Mikey Bussing DC Jr., et al., 2013) is: 3.1%   Values used to calculate the score:     Age: 31 years     Sex: Female     Is Non-Hispanic African American: No     Diabetic: No     Tobacco smoker: No     Systolic Blood Pressure: 841 mmHg     Is BP treated: Yes     HDL Cholesterol: 50 mg/dL     Total Cholesterol: 239 mg/dL   Relevant past medical, surgical, family and social history reviewed and updated as indicated. Interim medical history since our last visit reviewed. Allergies and medications reviewed and updated.  Review of Systems  Constitutional: Negative for activity change, appetite change, diaphoresis, fatigue and fever.  Respiratory: Negative for cough, chest tightness, shortness of breath and wheezing.   Cardiovascular: Negative for chest pain, palpitations and leg swelling.  Gastrointestinal: Negative.   Endocrine: Negative for cold intolerance and heat intolerance.  Neurological: Positive for numbness. Negative for dizziness, syncope, weakness, light-headedness and headaches.  Psychiatric/Behavioral: Negative.     Per HPI unless specifically indicated above     Objective:    BP 110/77 (BP Location: Left Arm, Patient Position: Sitting, Cuff Size: Normal)   Pulse 90   Temp 97.8 F (36.6 C) (Oral)  Resp 16   Ht 5' 5"  (1.651 m)   Wt 183 lb 12.8 oz (83.4 kg)   LMP 01/16/2010 (Approximate)   SpO2 98%   BMI 30.59 kg/m   Wt Readings from Last 3 Encounters:  12/22/19 183 lb 12.8 oz (83.4 kg)  07/31/18 165 lb (74.8 kg)  01/10/18 174 lb 9.6 oz (79.2 kg)    Physical Exam Vitals and nursing note reviewed.  Constitutional:      General: She is awake. She is not in acute distress.    Appearance: She is well-developed and well-groomed. She is obese. She is not ill-appearing.  HENT:     Head: Normocephalic.     Right Ear: Hearing normal.     Left Ear:  Hearing normal.  Eyes:     General: Lids are normal.        Right eye: No discharge.        Left eye: No discharge.     Conjunctiva/sclera: Conjunctivae normal.     Pupils: Pupils are equal, round, and reactive to light.  Neck:     Thyroid: No thyromegaly.     Vascular: No carotid bruit.  Cardiovascular:     Rate and Rhythm: Normal rate and regular rhythm.     Pulses:          Dorsalis pedis pulses are 2+ on the right side and 2+ on the left side.       Posterior tibial pulses are 2+ on the right side and 2+ on the left side.     Heart sounds: Normal heart sounds. No murmur heard.  No gallop.   Pulmonary:     Effort: Pulmonary effort is normal. No accessory muscle usage or respiratory distress.     Breath sounds: Normal breath sounds.  Abdominal:     General: Bowel sounds are normal.     Palpations: Abdomen is soft.  Musculoskeletal:     Cervical back: Normal range of motion and neck supple.     Right lower leg: No edema.     Left lower leg: No edema.     Right foot: Normal range of motion.     Left foot: Normal range of motion.     Comments: Strength BUE 5/5 and BLE 5/5.  Feet:     Right foot:     Protective Sensation: 2 sites tested. 10 sites sensed.     Skin integrity: Dry skin present.     Toenail Condition: Right toenails are abnormally thick.     Left foot:     Protective Sensation: 4 sites tested. 10 sites sensed.     Skin integrity: Dry skin present.     Toenail Condition: Left toenails are abnormally thick.  Lymphadenopathy:     Cervical: No cervical adenopathy.  Skin:    General: Skin is warm and dry.  Neurological:     Mental Status: She is alert and oriented to person, place, and time.     Cranial Nerves: Cranial nerves are intact.     Sensory: Sensory deficit present.     Motor: Motor function is intact.     Coordination: Coordination is intact.     Gait: Gait is intact.     Deep Tendon Reflexes: Reflexes are normal and symmetric.     Reflex Scores:       Brachioradialis reflexes are 2+ on the right side and 2+ on the left side.      Patellar reflexes are 2+ on the right side  and 2+ on the left side.    Comments: Sensation decreased bilateral feet, refer to foot exam.  Psychiatric:        Attention and Perception: Attention normal.        Mood and Affect: Mood normal.        Speech: Speech normal.        Behavior: Behavior normal. Behavior is cooperative.        Thought Content: Thought content normal.     Results for orders placed or performed in visit on 07/31/18  Microscopic Examination   URINE  Result Value Ref Range   WBC, UA 0-5 0 - 5 /hpf   RBC 0-2 0 - 2 /hpf   Epithelial Cells (non renal) 0-10 0 - 10 /hpf   Bacteria, UA Moderate (A) None seen/Few  Urine Culture, Reflex   URINE  Result Value Ref Range   Urine Culture, Routine Final report    Organism ID, Bacteria Comment   CBC with Differential/Platelet  Result Value Ref Range   WBC 8.5 3.4 - 10.8 x10E3/uL   RBC 4.58 3.77 - 5.28 x10E6/uL   Hemoglobin 17.0 (H) 11.1 - 15.9 g/dL   Hematocrit 47.4 (H) 34.0 - 46.6 %   MCV 104 (H) 79 - 97 fL   MCH 37.1 (H) 26.6 - 33.0 pg   MCHC 35.9 (H) 31 - 35 g/dL   RDW 12.6 11.7 - 15.4 %   Platelets 261 150 - 450 x10E3/uL   Neutrophils 56 Not Estab. %   Lymphs 35 Not Estab. %   Monocytes 6 Not Estab. %   Eos 2 Not Estab. %   Basos 1 Not Estab. %   Neutrophils Absolute 4.8 1.40 - 7.00 x10E3/uL   Lymphocytes Absolute 3.0 0 - 3 x10E3/uL   Monocytes Absolute 0.5 0 - 0 x10E3/uL   EOS (ABSOLUTE) 0.1 0.0 - 0.4 x10E3/uL   Basophils Absolute 0.0 0 - 0 x10E3/uL   Immature Granulocytes 0 Not Estab. %   Immature Grans (Abs) 0.0 0.0 - 0.1 x10E3/uL  Bayer DCA Hb A1c Waived  Result Value Ref Range   HB A1C (BAYER DCA - WAIVED) 5.0 <7.0 %  Comprehensive metabolic panel  Result Value Ref Range   Glucose 113 (H) 65 - 99 mg/dL   BUN 10 6 - 24 mg/dL   Creatinine, Ser 0.93 0.57 - 1.00 mg/dL   GFR calc non Af Amer 69 >59 mL/min/1.73   GFR calc  Af Amer 79 >59 mL/min/1.73   BUN/Creatinine Ratio 11 9 - 23   Sodium 141 134 - 144 mmol/L   Potassium 4.1 3.5 - 5.2 mmol/L   Chloride 102 96 - 106 mmol/L   CO2 24 20 - 29 mmol/L   Calcium 10.1 8.7 - 10.2 mg/dL   Total Protein 6.9 6.0 - 8.5 g/dL   Albumin 4.5 3.8 - 4.9 g/dL   Globulin, Total 2.4 1.5 - 4.5 g/dL   Albumin/Globulin Ratio 1.9 1.2 - 2.2   Bilirubin Total 0.4 0.0 - 1.2 mg/dL   Alkaline Phosphatase 76 39 - 117 IU/L   AST 48 (H) 0 - 40 IU/L   ALT 33 (H) 0 - 32 IU/L  Lipid Panel w/o Chol/HDL Ratio  Result Value Ref Range   Cholesterol, Total 239 (H) 100 - 199 mg/dL   Triglycerides 225 (H) 0 - 149 mg/dL   HDL 50 >39 mg/dL   VLDL Cholesterol Cal 45 (H) 5 - 40 mg/dL   LDL Calculated 144 (H) 0 -  99 mg/dL  TSH  Result Value Ref Range   TSH 4.280 0.450 - 4.500 uIU/mL  UA/M w/rflx Culture, Routine   Specimen: Urine   URINE  Result Value Ref Range   Specific Gravity, UA 1.010 1.005 - 1.030   pH, UA 7.0 5.0 - 7.5   Color, UA Yellow Yellow   Appearance Ur Hazy (A) Clear   Leukocytes,UA Negative Negative   Protein,UA 1+ (A) Negative/Trace   Glucose, UA Negative Negative   Ketones, UA Negative Negative   RBC, UA Trace (A) Negative   Bilirubin, UA Negative Negative   Urobilinogen, Ur 0.2 0.2 - 1.0 mg/dL   Nitrite, UA Negative Negative   Microscopic Examination See below:    Urinalysis Reflex Comment       Assessment & Plan:   Problem List Items Addressed This Visit      Other   Hyperlipidemia    Chronic, stable with ASCVD 3.1%.  Continue focus on diet regimen at home.   Last labs in June 2020, will obtain lipid panel today.      Obesity    Recommended eating smaller high protein, low fat meals more frequently and exercising 30 mins a day 5 times a week with a goal of 10-15lb weight loss in the next 3 months. Patient voiced their understanding and motivation to adhere to these recommendations.       Leg numbness - Primary    Ongoing issue for over a year with  worsening, now some in fingertips.  No recent falls or red flag symptoms.  Family history neuropathy, maternal.  Decreased sensation bilateral feet on exam.  Will further assess lumbar spine, MRI ordered.  Labs today to include RPR, TSH, CMP, CBC, ESR, CRP, B12, iron, A1c.  Will place referral to neurology for further assessment.  She does not wish to initiate treatment until further assessment done.  Return in 6 weeks, sooner if worsening.      Relevant Orders   Lipid Panel w/o Chol/HDL Ratio   B12 and Folate Panel   Iron, TIBC and Ferritin Panel   CBC with Differential/Platelet   Comprehensive metabolic panel   TSH   Sed Rate (ESR)   C-reactive protein   HgB A1c   RPR   MR Lumbar Spine Wo Contrast   Ambulatory referral to Neurology       Follow up plan: Return in about 6 weeks (around 02/02/2020) for Neuropathy.

## 2019-12-22 NOTE — Patient Instructions (Signed)
Neuropathic Pain Neuropathic pain is pain caused by damage to the nerves that are responsible for certain sensations in your body (sensory nerves). The pain can be caused by:  Damage to the sensory nerves that send signals to your spinal cord and brain (peripheral nervous system).  Damage to the sensory nerves in your brain or spinal cord (central nervous system). Neuropathic pain can make you more sensitive to pain. Even a minor sensation can feel very painful. This is usually a long-term condition that can be difficult to treat. The type of pain differs from person to person. It may:  Start suddenly (acute), or it may develop slowly and last for a long time (chronic).  Come and go as damaged nerves heal, or it may stay at the same level for years.  Cause emotional distress, loss of sleep, and a lower quality of life. What are the causes? The most common cause of this condition is diabetes. Many other diseases and conditions can also cause neuropathic pain. Causes of neuropathic pain can be classified as:  Toxic. This is caused by medicines and chemicals. The most common cause of toxic neuropathic pain is damage from cancer treatments (chemotherapy).  Metabolic. This can be caused by: ? Diabetes. This is the most common disease that damages the nerves. ? Lack of vitamin B from long-term alcohol abuse.  Traumatic. Any injury that cuts, crushes, or stretches a nerve can cause damage and pain. A common example is feeling pain after losing an arm or leg (phantom limb pain).  Compression-related. If a sensory nerve gets trapped or compressed for a long period of time, the blood supply to the nerve can be cut off.  Vascular. Many blood vessel diseases can cause neuropathic pain by decreasing blood supply and oxygen to nerves.  Autoimmune. This type of pain results from diseases in which the body's defense system (immune system) mistakenly attacks sensory nerves. Examples of autoimmune diseases  that can cause neuropathic pain include lupus and multiple sclerosis.  Infectious. Many types of viral infections can damage sensory nerves and cause pain. Shingles infection is a common cause of this type of pain.  Inherited. Neuropathic pain can be a symptom of many diseases that are passed down through families (genetic). What increases the risk? You are more likely to develop this condition if:  You have diabetes.  You smoke.  You drink too much alcohol.  You are taking certain medicines, including medicines that kill cancer cells (chemotherapy) or that treat immune system disorders. What are the signs or symptoms? The main symptom is pain. Neuropathic pain is often described as:  Burning.  Shock-like.  Stinging.  Hot or cold.  Itching. How is this diagnosed? No single test can diagnose neuropathic pain. It is diagnosed based on:  Physical exam and your symptoms. Your health care provider will ask you about your pain. You may be asked to use a pain scale to describe how bad your pain is.  Tests. These may be done to see if you have a high sensitivity to pain and to help find the cause and location of any sensory nerve damage. They include: ? Nerve conduction studies to test how well nerve signals travel through your sensory nerves (electrodiagnostic testing). ? Stimulating your sensory nerves through electrodes on your skin and measuring the response in your spinal cord and brain (somatosensory evoked potential).  Imaging studies, such as: ? X-rays. ? CT scan. ? MRI. How is this treated? Treatment for neuropathic pain may change   over time. You may need to try different treatment options or a combination of treatments. Some options include:  Treating the underlying cause of the neuropathy, such as diabetes, kidney disease, or vitamin deficiencies.  Stopping medicines that can cause neuropathy, such as chemotherapy.  Medicine to relieve pain. Medicines may  include: ? Prescription or over-the-counter pain medicine. ? Anti-seizure medicine. ? Antidepressant medicines. ? Pain-relieving patches that are applied to painful areas of skin. ? A medicine to numb the area (local anesthetic), which can be injected as a nerve block.  Transcutaneous nerve stimulation. This uses electrical currents to block painful nerve signals. The treatment is painless.  Alternative treatments, such as: ? Acupuncture. ? Meditation. ? Massage. ? Physical therapy. ? Pain management programs. ? Counseling. Follow these instructions at home: Medicines   Take over-the-counter and prescription medicines only as told by your health care provider.  Do not drive or use heavy machinery while taking prescription pain medicine.  If you are taking prescription pain medicine, take actions to prevent or treat constipation. Your health care provider may recommend that you: ? Drink enough fluid to keep your urine pale yellow. ? Eat foods that are high in fiber, such as fresh fruits and vegetables, whole grains, and beans. ? Limit foods that are high in fat and processed sugars, such as fried or sweet foods. ? Take an over-the-counter or prescription medicine for constipation. Lifestyle   Have a good support system at home.  Consider joining a chronic pain support group.  Do not use any products that contain nicotine or tobacco, such as cigarettes and e-cigarettes. If you need help quitting, ask your health care provider.  Do not drink alcohol. General instructions  Learn as much as you can about your condition.  Work closely with all your health care providers to find the treatment plan that works best for you.  Ask your health care provider what activities are safe for you.  Keep all follow-up visits as told by your health care provider. This is important. Contact a health care provider if:  Your pain treatments are not working.  You are having side effects  from your medicines.  You are struggling with tiredness (fatigue), mood changes, depression, or anxiety. Summary  Neuropathic pain is pain caused by damage to the nerves that are responsible for certain sensations in your body (sensory nerves).  Neuropathic pain may come and go as damaged nerves heal, or it may stay at the same level for years.  Neuropathic pain is usually a long-term condition that can be difficult to treat. Consider joining a chronic pain support group. This information is not intended to replace advice given to you by your health care provider. Make sure you discuss any questions you have with your health care provider. Document Revised: 06/05/2018 Document Reviewed: 03/01/2017 Elsevier Patient Education  2020 [employer]  

## 2019-12-22 NOTE — Assessment & Plan Note (Signed)
Ongoing issue for over a year with worsening, now some in fingertips.  No recent falls or red flag symptoms.  Family history neuropathy, maternal.  Decreased sensation bilateral feet on exam.  Will further assess lumbar spine, MRI ordered.  Labs today to include RPR, TSH, CMP, CBC, ESR, CRP, B12, iron, A1c.  Will place referral to neurology for further assessment.  She does not wish to initiate treatment until further assessment done.  Return in 6 weeks, sooner if worsening.

## 2019-12-23 LAB — CBC WITH DIFFERENTIAL/PLATELET
Basophils Absolute: 0.1 x10E3/uL (ref 0.0–0.2)
Basos: 1 %
EOS (ABSOLUTE): 0.1 x10E3/uL (ref 0.0–0.4)
Eos: 1 %
Hematocrit: 41.3 % (ref 34.0–46.6)
Hemoglobin: 14 g/dL (ref 11.1–15.9)
Immature Grans (Abs): 0 x10E3/uL (ref 0.0–0.1)
Immature Granulocytes: 0 %
Lymphocytes Absolute: 3.8 x10E3/uL — ABNORMAL HIGH (ref 0.7–3.1)
Lymphs: 41 %
MCH: 31.3 pg (ref 26.6–33.0)
MCHC: 33.9 g/dL (ref 31.5–35.7)
MCV: 92 fL (ref 79–97)
Monocytes Absolute: 0.7 x10E3/uL (ref 0.1–0.9)
Monocytes: 7 %
Neutrophils Absolute: 4.6 x10E3/uL (ref 1.4–7.0)
Neutrophils: 50 %
Platelets: 299 x10E3/uL (ref 150–450)
RBC: 4.48 x10E6/uL (ref 3.77–5.28)
RDW: 13.5 % (ref 11.7–15.4)
WBC: 9.3 x10E3/uL (ref 3.4–10.8)

## 2019-12-23 LAB — B12 AND FOLATE PANEL
Folate: 18.6 ng/mL (ref >3.0)
Vitamin B-12: 616 pg/mL (ref 232–1245)

## 2019-12-23 LAB — LIPID PANEL W/O CHOL/HDL RATIO
Cholesterol, Total: 292 mg/dL — ABNORMAL HIGH (ref 100–199)
HDL: 41 mg/dL
LDL Chol Calc (NIH): 197 mg/dL — ABNORMAL HIGH (ref 0–99)
Triglycerides: 273 mg/dL — ABNORMAL HIGH (ref 0–149)
VLDL Cholesterol Cal: 54 mg/dL — ABNORMAL HIGH (ref 5–40)

## 2019-12-23 LAB — SYPHILIS: RPR W/REFLEX TO RPR TITER AND TREPONEMAL ANTIBODIES, TRADITIONAL SCREENING AND DIAGNOSIS ALGORITHM: RPR Ser Ql: NONREACTIVE

## 2019-12-23 LAB — COMPREHENSIVE METABOLIC PANEL WITH GFR
ALT: 13 IU/L (ref 0–32)
AST: 15 IU/L (ref 0–40)
Albumin/Globulin Ratio: 2.5 — ABNORMAL HIGH (ref 1.2–2.2)
Albumin: 4.9 g/dL (ref 3.8–4.9)
Alkaline Phosphatase: 60 IU/L (ref 44–121)
BUN/Creatinine Ratio: 17 (ref 9–23)
BUN: 15 mg/dL (ref 6–24)
Bilirubin Total: 0.4 mg/dL (ref 0.0–1.2)
CO2: 23 mmol/L (ref 20–29)
Calcium: 10.3 mg/dL — ABNORMAL HIGH (ref 8.7–10.2)
Chloride: 101 mmol/L (ref 96–106)
Creatinine, Ser: 0.86 mg/dL (ref 0.57–1.00)
GFR calc Af Amer: 87 mL/min/1.73
GFR calc non Af Amer: 75 mL/min/1.73
Globulin, Total: 2 g/dL (ref 1.5–4.5)
Glucose: 121 mg/dL — ABNORMAL HIGH (ref 65–99)
Potassium: 3.8 mmol/L (ref 3.5–5.2)
Sodium: 140 mmol/L (ref 134–144)
Total Protein: 6.9 g/dL (ref 6.0–8.5)

## 2019-12-23 LAB — C-REACTIVE PROTEIN: CRP: 5 mg/L (ref 0–10)

## 2019-12-23 LAB — HEMOGLOBIN A1C
Est. average glucose Bld gHb Est-mCnc: 134 mg/dL
Hgb A1c MFr Bld: 6.3 % — ABNORMAL HIGH (ref 4.8–5.6)

## 2019-12-23 LAB — IRON,TIBC AND FERRITIN PANEL
Ferritin: 749 ng/mL — ABNORMAL HIGH (ref 15–150)
Iron Saturation: 34 % (ref 15–55)
Iron: 101 ug/dL (ref 27–159)
Total Iron Binding Capacity: 294 ug/dL (ref 250–450)
UIBC: 193 ug/dL (ref 131–425)

## 2019-12-23 LAB — TSH: TSH: 2.35 u[IU]/mL (ref 0.450–4.500)

## 2019-12-23 LAB — SEDIMENTATION RATE: Sed Rate: 24 mm/hr (ref 0–40)

## 2019-12-23 NOTE — Progress Notes (Signed)
Contacted via MyChart The 10-year ASCVD risk score Mikey Bussing DC Jr., et al., 2013) is: 4.4%   Values used to calculate the score:     Age: 59 years     Sex: Female     Is Non-Hispanic African American: No     Diabetic: No     Tobacco smoker: No     Systolic Blood Pressure: 352 mmHg     Is BP treated: Yes     HDL Cholesterol: 41 mg/dL     Total Cholesterol: 292 mg/dL Good evening Drinda, your labs have returned: - B12 level, Folate, iron, CBC, kidney, liver, and thyroid look good - ESR and CRP, inflammatory labs, are normal.  Syphilis testing is negative. - A1C shows some prediabetes at 6.3%, prediabetes is any number 5.7 to 6.4%, if this reaches 6.5% or greater this is diabetes.  I recommend heavy focus on diet and regular exercise to avoid this trending upwards. - Ferritin level is elevated at 749, this can sometimes be an inflammation indicator.  I would like to recheck this at your next visit.  Do you take any iron or supplements at home, I see B12? - Cholesterol levels remain quite elevated would like to recheck these fasting next visit -- if LDL continues to be elevated we may want to consider a statin in the future.  Any questions? Keep being awesome!!  Thank you for allowing me to participate in your care. Kindest regards, Tywanna Seifer

## 2020-01-11 ENCOUNTER — Other Ambulatory Visit: Payer: Self-pay

## 2020-01-11 ENCOUNTER — Ambulatory Visit
Admission: RE | Admit: 2020-01-11 | Discharge: 2020-01-11 | Disposition: A | Discharge status: Home / Self Care | Payer: 59 | Source: Ambulatory Visit | Attending: Nurse Practitioner | Admitting: Nurse Practitioner

## 2020-01-11 DIAGNOSIS — R200 Anesthesia of skin: Secondary | ICD-10-CM | POA: Diagnosis not present

## 2020-01-11 IMAGING — MR MR LUMBAR SPINE W/O CM
5 series · 31 of 48 positions shown · non-contrast
Comparison: None.

CLINICAL DATA: Lumbar radiculopathy

EXAM:
MRI LUMBAR SPINE WITHOUT CONTRAST
TECHNIQUE: Multiplanar, multisequence MR imaging of the lumbar spine was
performed. No intravenous contrast was administered.

[Series 6: T1 · sagittal · 4.0mm · 0.81mm/px · 5 of 15 slices shown (1 of 2)]
[im 1/15]
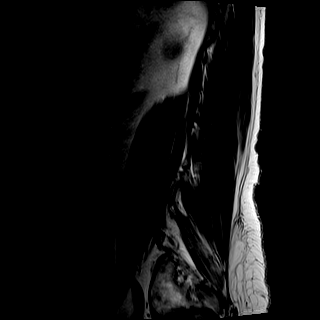
[im 4/15]
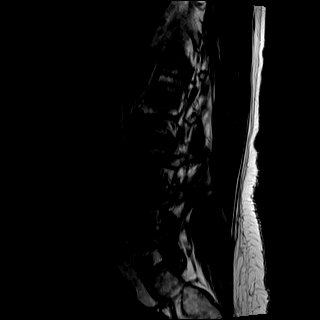
[im 8/15]
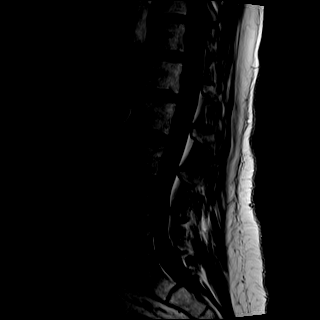
[im 11/15]
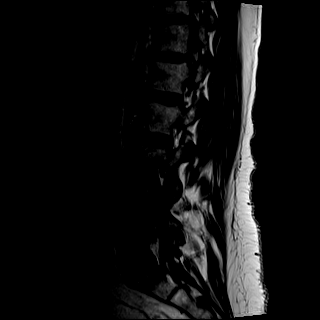
[im 15/15]
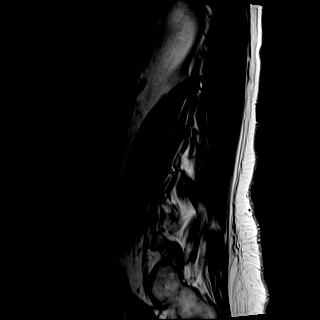

[Series 7: STIR · sagittal · 4.0mm · 0.41mm/px · 1 of 15 slices shown]
[im 1/15]
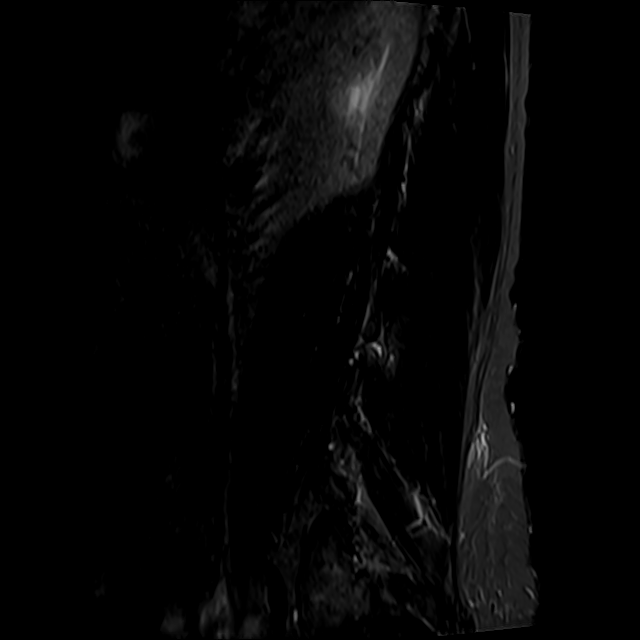

[Series 8: T2 · axial · 4.0mm · 0.78mm/px · z∈[-98,+117]mm · 9 of 36 slices shown (1 of 2)]
[im 1/36]
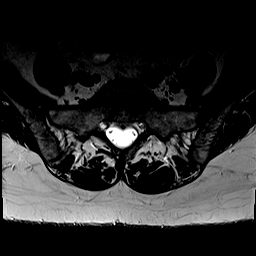
[im 6/36]
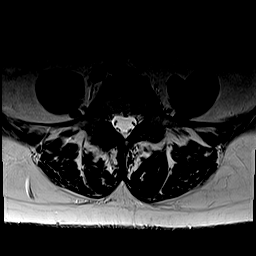
[im 11/36]
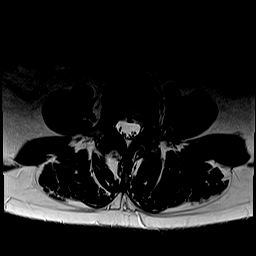
[im 16/36]
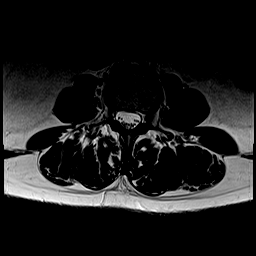
[im 18/36]
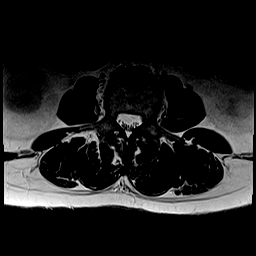
[im 21/36]
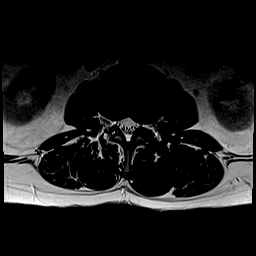
[im 26/36]
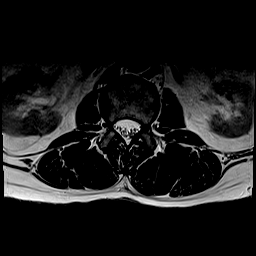
[im 31/36]
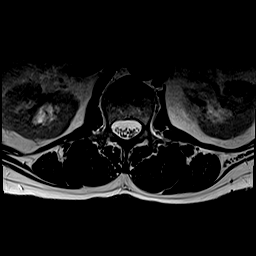
[im 36/36]
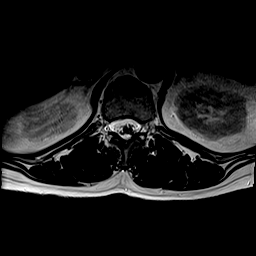

[Series 9: T1 · axial · 4.0mm · 0.39mm/px · z∈[-98,+117]mm · 9 of 36 slices shown (2 of 2)]
[im 1/36]
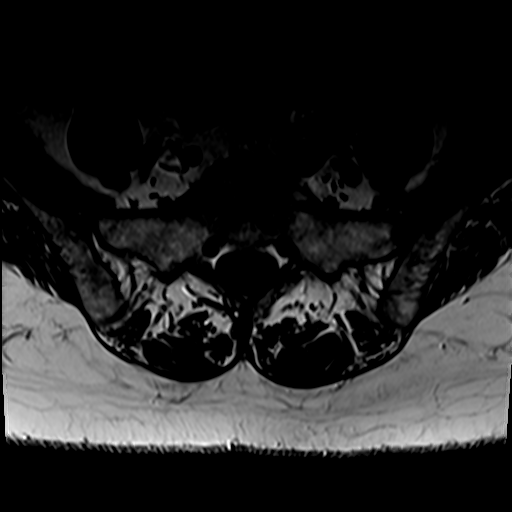
[im 6/36]
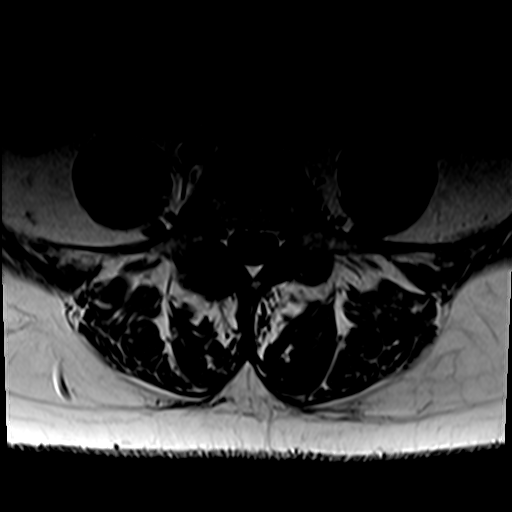
[im 11/36]
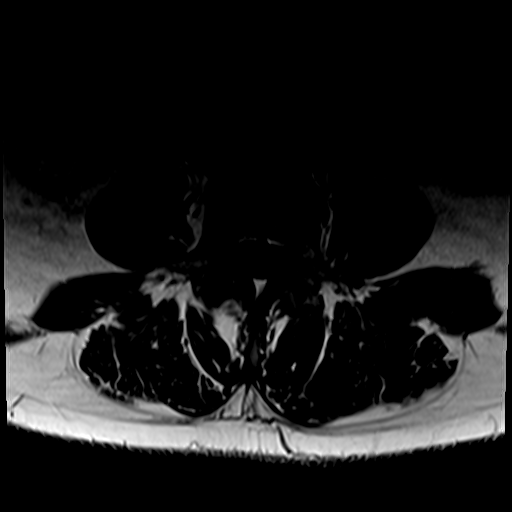
[im 16/36]
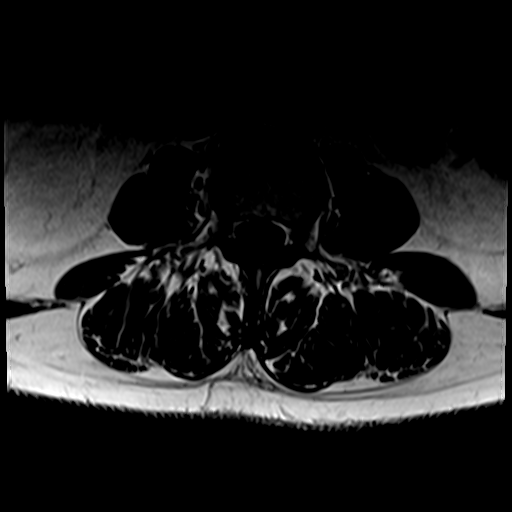
[im 18/36]
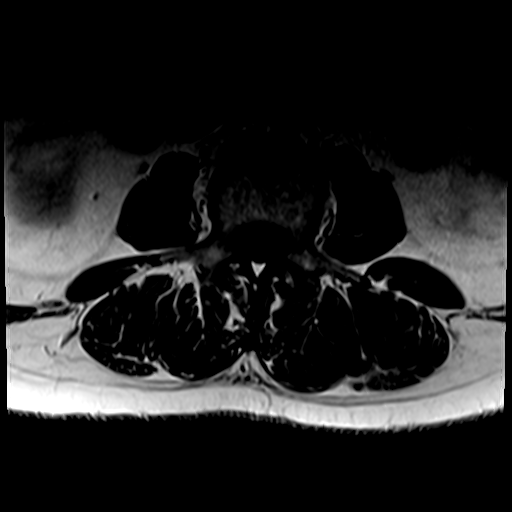
[im 21/36]
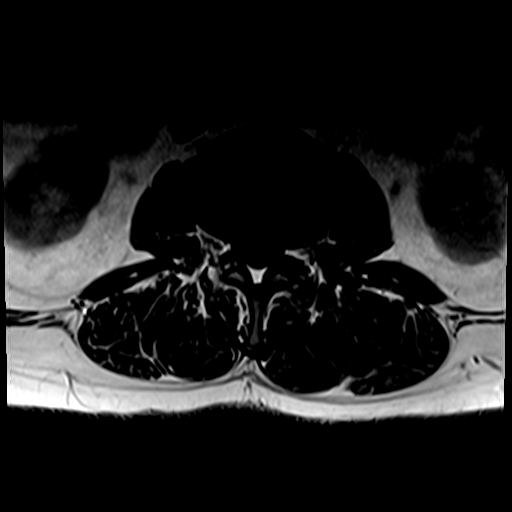
[im 26/36]
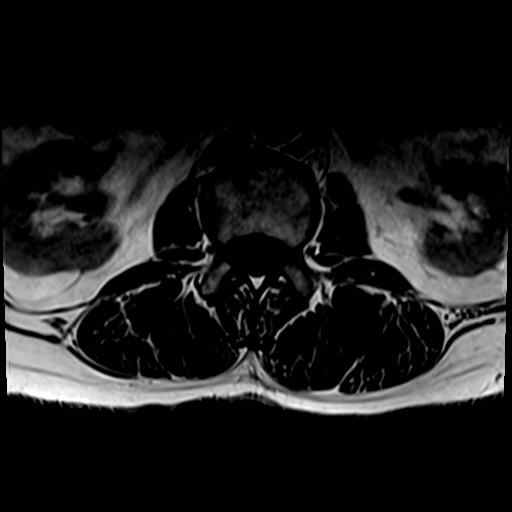
[im 31/36]
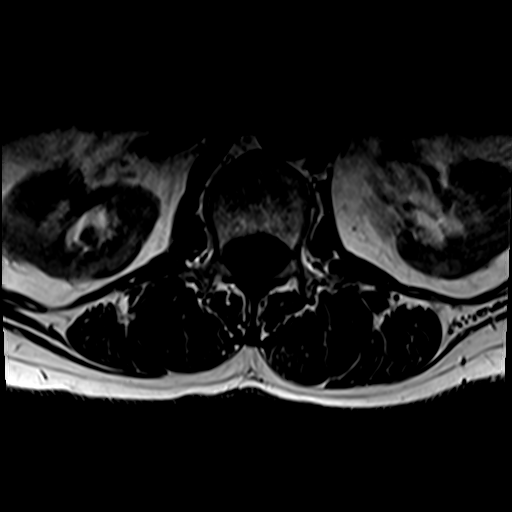
[im 36/36]
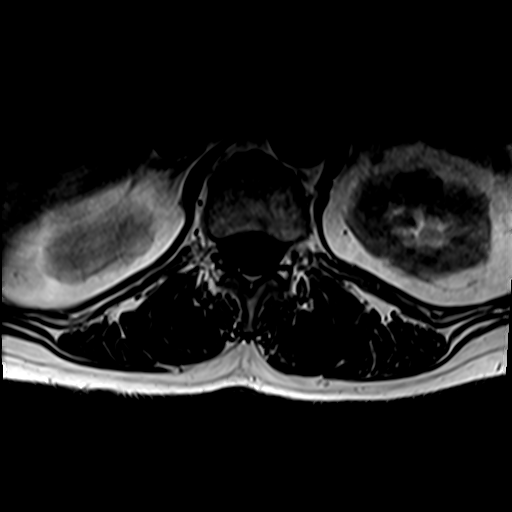

[Series 10: T2 · sagittal · 4.0mm · 0.81mm/px · 7 of 16 slices shown (2 of 2)]
[im 1/16]
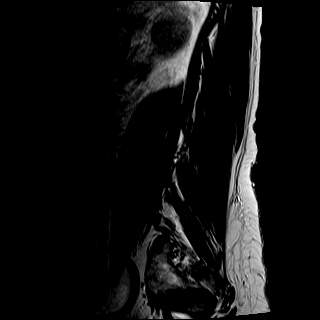
[im 3/16]
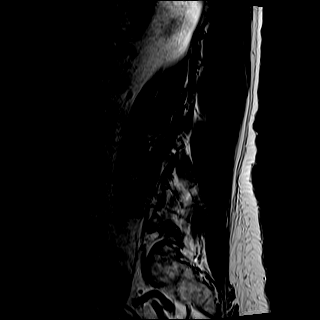
[im 6/16]
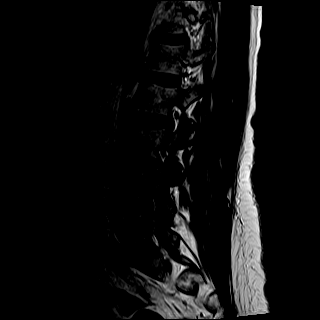
[im 8/16]
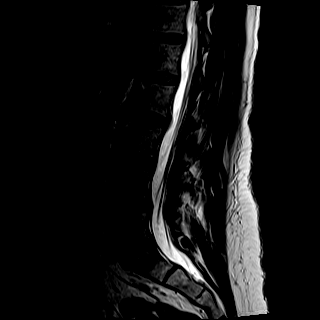
[im 11/16]
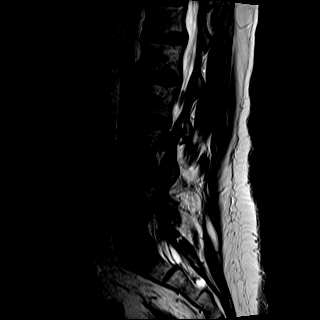
[im 13/16]
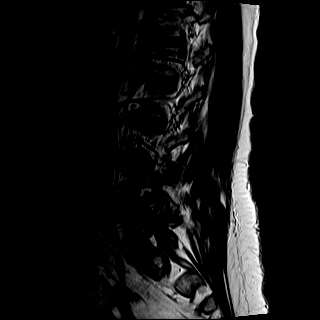
[im 16/16]
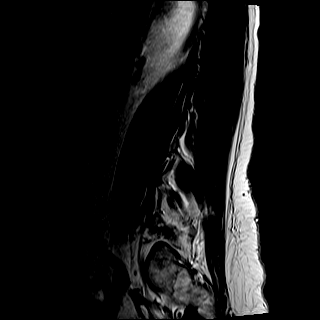

[31 of 48 positions shown; findings below may reference images not displayed]

FINDINGS: Segmentation: There are 5 non-rib bearing lumbar type vertebral
bodies with the last intervertebral disc space labeled as L5-S1.

Alignment:  Normal

Vertebrae: The vertebral body heights are well maintained. No
fracture, marrow edema,or pathologic marrow infiltration.

Conus medullaris and cauda equina: Conus extends to the L1 level.
Conus and cauda equina appear normal.

Paraspinal and other soft tissues: The paraspinal soft tissues and
visualized retroperitoneal structures are unremarkable. The
sacroiliac joints are intact.

Disc levels:

T12-L1:  No significant canal or neural foraminal narrowing.

L1-L2:   No significant canal or neural foraminal narrowing.

L2-L3: There is a broad-based disc bulge with a left lateral recess
disc protrusion which contacts the descending left L3 nerve root.
There is moderate left and mild right neural foraminal narrowing and
mild central canal stenosis.

L3-L4: There is a broad-based disc bulge with a left foraminal
annular fissure. There is mild to moderate left and mild right
neural foraminal narrowing and mild effacement anterior thecal sac.

L4-L5: There is a broad-based disc bulge with a left far lateral
annular fissure and facet arthrosis. Moderate bilateral neural
foraminal narrowing is seen and mild effacement anterior thecal sac.

L5-S1: There is a broad-based disc bulge with facet arthrosis which
causes moderate bilateral neural foraminal narrowing. There is a
posterior right synovial cyst present.
IMPRESSION: 1. Lumbar spine spondylosis most notable at L2-L3 with a left
lateral recess disc protrusion contacting the descending left L3
nerve root. Moderate left and mild right neural foraminal narrowing
as well as mild central canal stenosis is seen.
2. Also at L3-L4 and L4-L5 there are left sided annular fissures.

## 2020-01-12 NOTE — Progress Notes (Signed)
Contacted via MyChart  Good evening Jo Martinez, your imaging has returned.  It does note a disc protruding (bulging) in lower spine at L2-L3 which is connecting with a nerve root there.  You also have some narrowing present.  We had placed you to see neurology, but you may actually benefit from seeing neurosurgery for further recommendations due to findings on the imaging.  Some of this could be causing your lower extremity symptoms.  May also benefit from some physical therapy.  How are you feeling?  Let me know your thoughts on above and if any questions. Keep being awesome!!  Thank you for allowing me to participate in your care. Kindest regards, Boyde Grieco

## 2020-01-13 ENCOUNTER — Other Ambulatory Visit: Payer: Self-pay | Admitting: Nurse Practitioner

## 2020-01-13 DIAGNOSIS — M47816 Spondylosis without myelopathy or radiculopathy, lumbar region: Secondary | ICD-10-CM

## 2020-01-28 ENCOUNTER — Other Ambulatory Visit: Payer: Self-pay | Admitting: Neurosurgery

## 2020-01-28 DIAGNOSIS — R200 Anesthesia of skin: Secondary | ICD-10-CM

## 2020-02-02 ENCOUNTER — Ambulatory Visit: Payer: 59 | Admitting: Nurse Practitioner

## 2020-02-02 ENCOUNTER — Other Ambulatory Visit: Payer: Self-pay

## 2020-02-02 ENCOUNTER — Encounter: Payer: Self-pay | Admitting: Nurse Practitioner

## 2020-02-02 VITALS — BP 119/79 | HR 75 | Temp 97.5°F | Ht 65.71 in | Wt 185.2 lb

## 2020-02-02 DIAGNOSIS — E6609 Other obesity due to excess calories: Secondary | ICD-10-CM | POA: Diagnosis not present

## 2020-02-02 DIAGNOSIS — Z6830 Body mass index (BMI) 30.0-30.9, adult: Secondary | ICD-10-CM | POA: Diagnosis not present

## 2020-02-02 DIAGNOSIS — R7309 Other abnormal glucose: Secondary | ICD-10-CM | POA: Diagnosis not present

## 2020-02-02 DIAGNOSIS — R200 Anesthesia of skin: Secondary | ICD-10-CM

## 2020-02-02 DIAGNOSIS — E1142 Type 2 diabetes mellitus with diabetic polyneuropathy: Secondary | ICD-10-CM | POA: Insufficient documentation

## 2020-02-02 MED ORDER — VALACYCLOVIR HCL 500 MG PO TABS
500.0000 mg | ORAL_TABLET | Freq: Every day | ORAL | 4 refills | Status: DC
Start: 2020-02-02 — End: 2020-02-28

## 2020-02-02 MED ORDER — LOSARTAN POTASSIUM-HCTZ 100-25 MG PO TABS
1.0000 | ORAL_TABLET | Freq: Every day | ORAL | 4 refills | Status: DC
Start: 2020-02-02 — End: 2020-09-08

## 2020-02-02 MED ORDER — FLUOXETINE HCL 20 MG PO CAPS
ORAL_CAPSULE | ORAL | 4 refills | Status: DC
Start: 2020-02-02 — End: 2020-09-08

## 2020-02-02 NOTE — Assessment & Plan Note (Signed)
Noted on recent labs at 6.3%, recheck in February and initiate medication as needed.  Focus on diet and exercise at this time.

## 2020-02-02 NOTE — Progress Notes (Signed)
BP 119/79   Pulse 75   Temp (!) 97.5 F (36.4 C) (Oral)   Ht 5' 5.71" (1.669 m)   Wt 185 lb 3.2 oz (84 kg)   LMP 01/16/2010 (Approximate)   SpO2 98%   BMI 30.16 kg/m    Subjective:    Patient ID: Jo Martinez, female    DOB: Jan 01, 1961, 59 y.o.   MRN: [id number]  HPI: Jo Martinez is a 59 y.o. female  Chief Complaint  Patient presents with  . Neuropathy    B/L legs and feet   LEG NUMBNESS Follow-up for leg numbness, since last visit saw neurosurgery (Dr. Lacinda Axon) on 01/26/20 with plan for MRI thoracic and cervical spine + referral to neurology made.  Is scheduled next week for MRI imaging and sees neurology in January -- Dr. Melrose Nakayama at [facility].    Reports ongoing issues for over one year, initially seen in office June 2020 for lower back pain.  Had recent lumbar spine imaging on 11/27/19 noting "stable mild multilevel lumbar degenerative disc disease, most prominent at L4-L5" + mild lower lumbar facet arthropathy bilaterally. MRI on 01/11/20 noted "Lumbar spine spondylosis most notable at L2-L3 with a left lateral recess disc protrusion contacting the descending left L3nerve root. Moderate left and mild right neural foraminal narrowing as well as mild central canal stenosis is seen".  Last B12 check in 2021 October was 616  Has tried acupuncture and chiropractor, who thinks it is neuropathy.  Tried over the counter herbal medications, compression boots, inversion table.  She reports issue has progressively become worse, now from waist down.  Worse if tries to walk too long or stand too long.  She has noticed numbness in fingertips too.  Mother had issues with neuropathy.  She does not want to start medication at this time, until further testing and underlying cause assessed. Labs recently noted 6.3% A1C.  Does have history of rash years ago to right lower back, which she thinks was tick, but reports dermatology did biopsy and told her eczema. Duration: over one year Pain: numbness --  tingling and burning in toes, legs numbness and heaviness Severity: 8/10 discomfort Quality:  aching, burning and throbbing discomfort Location: lower legs and upper thighs Bilateral:  yes Onset: gradual Frequency: constant Time of  day:   all day Sudden unintentional leg jerking:   no Paresthesias:   yes Decreased sensation:  yes Weakness:   no Insomnia:  Baseline for her Fatigue:   no Alleviating factors: nothing Aggravating factors: walking too long or standing too long Status: worse Treatments attempted: chiropractor and acupuncturist  Relevant past medical, surgical, family and social history reviewed and updated as indicated. Interim medical history since our last visit reviewed. Allergies and medications reviewed and updated.  Review of Systems  Constitutional: Negative for activity change, appetite change, diaphoresis, fatigue and fever.  Respiratory: Negative for cough, chest tightness, shortness of breath and wheezing.   Cardiovascular: Negative for chest pain, palpitations and leg swelling.  Gastrointestinal: Negative.   Endocrine: Negative for cold intolerance and heat intolerance.  Neurological: Positive for numbness. Negative for dizziness, syncope, weakness, light-headedness and headaches.  Psychiatric/Behavioral: Negative.     Per HPI unless specifically indicated above     Objective:    BP 119/79   Pulse 75   Temp (!) 97.5 F (36.4 C) (Oral)   Ht 5' 5.71" (1.669 m)   Wt 185 lb 3.2 oz (84 kg)   LMP 01/16/2010 (Approximate)   SpO2 98%  BMI 30.16 kg/m   Wt Readings from Last 3 Encounters:  02/02/20 185 lb 3.2 oz (84 kg)  12/22/19 183 lb 12.8 oz (83.4 kg)  07/31/18 165 lb (74.8 kg)    Physical Exam Vitals and nursing note reviewed.  Constitutional:      General: She is awake. She is not in acute distress.    Appearance: She is well-developed and well-groomed. She is obese. She is not ill-appearing.  HENT:     Head: Normocephalic.     Right Ear:  Hearing normal.     Left Ear: Hearing normal.  Eyes:     General: Lids are normal.        Right eye: No discharge.        Left eye: No discharge.     Conjunctiva/sclera: Conjunctivae normal.     Pupils: Pupils are equal, round, and reactive to light.  Neck:     Thyroid: No thyromegaly.     Vascular: No carotid bruit.  Cardiovascular:     Rate and Rhythm: Normal rate and regular rhythm.     Pulses:          Dorsalis pedis pulses are 2+ on the right side and 2+ on the left side.       Posterior tibial pulses are 2+ on the right side and 2+ on the left side.     Heart sounds: Normal heart sounds. No murmur heard.  No gallop.   Pulmonary:     Effort: Pulmonary effort is normal. No accessory muscle usage or respiratory distress.     Breath sounds: Normal breath sounds.  Abdominal:     General: Bowel sounds are normal.     Palpations: Abdomen is soft.  Musculoskeletal:     Cervical back: Normal range of motion and neck supple.     Right lower leg: No edema.     Left lower leg: No edema.     Right foot: Normal range of motion.     Left foot: Normal range of motion.     Comments: Strength BUE 5/5 and BLE 5/5.  Feet:     Right foot:     Protective Sensation: 2 sites tested. 10 sites sensed.     Skin integrity: Dry skin present.     Toenail Condition: Right toenails are abnormally thick.     Left foot:     Protective Sensation: 4 sites tested. 10 sites sensed.     Skin integrity: Dry skin present.     Toenail Condition: Left toenails are abnormally thick.  Lymphadenopathy:     Cervical: No cervical adenopathy.  Skin:    General: Skin is warm and dry.  Neurological:     Mental Status: She is alert and oriented to person, place, and time.     Cranial Nerves: Cranial nerves are intact.     Sensory: Sensory deficit present.     Motor: Motor function is intact.     Coordination: Coordination is intact.     Gait: Gait is intact.     Deep Tendon Reflexes: Reflexes are normal and  symmetric.     Reflex Scores:      Brachioradialis reflexes are 2+ on the right side and 2+ on the left side.      Patellar reflexes are 2+ on the right side and 2+ on the left side.    Comments: Sensation decreased bilateral feet, refer to foot exam.  Psychiatric:        Attention and  Perception: Attention normal.        Mood and Affect: Mood normal.        Speech: Speech normal.        Behavior: Behavior normal. Behavior is cooperative.        Thought Content: Thought content normal.    Results for orders placed or performed in visit on 12/22/19  Lipid Panel w/o Chol/HDL Ratio  Result Value Ref Range   Cholesterol, Total 292 (H) 100 - 199 mg/dL   Triglycerides 273 (H) 0 - 149 mg/dL   HDL 41 >39 mg/dL   VLDL Cholesterol Cal 54 (H) 5 - 40 mg/dL   LDL Chol Calc (NIH) 197 (H) 0 - 99 mg/dL  B12 and Folate Panel  Result Value Ref Range   Vitamin B-12 616 232 - 1,245 pg/mL   Folate 18.6 >3.0 ng/mL  Iron, TIBC and Ferritin Panel  Result Value Ref Range   Total Iron Binding Capacity 294 250 - 450 ug/dL   UIBC 193 131 - 425 ug/dL   Iron 101 27 - 159 ug/dL   Iron Saturation 34 15 - 55 %   Ferritin 749 (H) 15.0 - 150.0 ng/mL  CBC with Differential/Platelet  Result Value Ref Range   WBC 9.3 3.4 - 10.8 x10E3/uL   RBC 4.48 3.77 - 5.28 x10E6/uL   Hemoglobin 14.0 11.1 - 15.9 g/dL   Hematocrit 41.3 34.0 - 46.6 %   MCV 92 79 - 97 fL   MCH 31.3 26.6 - 33.0 pg   MCHC 33.9 31 - 35 g/dL   RDW 13.5 11.7 - 15.4 %   Platelets 299 150 - 450 x10E3/uL   Neutrophils 50 Not Estab. %   Lymphs 41 Not Estab. %   Monocytes 7 Not Estab. %   Eos 1 Not Estab. %   Basos 1 Not Estab. %   Neutrophils Absolute 4.6 1.40 - 7.00 x10E3/uL   Lymphocytes Absolute 3.8 (H) 0 - 3 x10E3/uL   Monocytes Absolute 0.7 0 - 0 x10E3/uL   EOS (ABSOLUTE) 0.1 0.0 - 0.4 x10E3/uL   Basophils Absolute 0.1 0 - 0 x10E3/uL   Immature Granulocytes 0 Not Estab. %   Immature Grans (Abs) 0.0 0.0 - 0.1 x10E3/uL  Comprehensive  metabolic panel  Result Value Ref Range   Glucose 121 (H) 65 - 99 mg/dL   BUN 15 6 - 24 mg/dL   Creatinine, Ser 0.86 0.57 - 1.00 mg/dL   GFR calc non Af Amer 75 >59 mL/min/1.73   GFR calc Af Amer 87 >59 mL/min/1.73   BUN/Creatinine Ratio 17 9 - 23   Sodium 140 134 - 144 mmol/L   Potassium 3.8 3.5 - 5.2 mmol/L   Chloride 101 96 - 106 mmol/L   CO2 23 20 - 29 mmol/L   Calcium 10.3 (H) 8.7 - 10.2 mg/dL   Total Protein 6.9 6.0 - 8.5 g/dL   Albumin 4.9 3.8 - 4.9 g/dL   Globulin, Total 2.0 1.5 - 4.5 g/dL   Albumin/Globulin Ratio 2.5 (H) 1.2 - 2.2   Bilirubin Total 0.4 0.0 - 1.2 mg/dL   Alkaline Phosphatase 60 44 - 121 IU/L   AST 15 0 - 40 IU/L   ALT 13 0 - 32 IU/L  TSH  Result Value Ref Range   TSH 2.350 0.450 - 4.500 uIU/mL  Sed Rate (ESR)  Result Value Ref Range   Sed Rate 24 0 - 40 mm/hr  C-reactive protein  Result Value Ref Range   CRP  5 0 - 10 mg/L  HgB A1c  Result Value Ref Range   Hgb A1c MFr Bld 6.3 (H) 4.8 - 5.6 %   Est. average glucose Bld gHb Est-mCnc 134 mg/dL  RPR  Result Value Ref Range   RPR Ser Ql Non Reactive Non Reactive      Assessment & Plan:   Problem List Items Addressed This Visit      Other   Obesity    Recommended eating smaller high protein, low fat meals more frequently and exercising 30 mins a day 5 times a week with a goal of 10-15lb weight loss in the next 3 months. Patient voiced their understanding and motivation to adhere to these recommendations.       Leg numbness - Primary    Ongoing issue for over a year with worsening, now some in fingertips.  No recent falls or red flag symptoms.  Family history neuropathy, maternal.  Decreased sensation bilateral feet on exam.  Will continue collaboration with neurosurgery and neurology, recent neurosurgery note reviewed.  She does not wish to initiate treatment until further assessment done.  Return in February for follow-up, sooner if worsening.      Elevated hemoglobin A1c    Noted on recent  labs at 6.3%, recheck in February and initiate medication as needed.  Focus on diet and exercise at this time.          Follow up plan: Return in about 2 months (around 04/04/2020) for HTN/HLD, MOOD, NEUROPATHY, A1C CHECK.

## 2020-02-02 NOTE — Assessment & Plan Note (Signed)
Recommended eating smaller high protein, low fat meals more frequently and exercising 30 mins a day 5 times a week with a goal of 10-15lb weight loss in the next 3 months. Patient voiced their understanding and motivation to adhere to these recommendations.  

## 2020-02-02 NOTE — Assessment & Plan Note (Signed)
Ongoing issue for over a year with worsening, now some in fingertips.  No recent falls or red flag symptoms.  Family history neuropathy, maternal.  Decreased sensation bilateral feet on exam.  Will continue collaboration with neurosurgery and neurology, recent neurosurgery note reviewed.  She does not wish to initiate treatment until further assessment done.  Return in February for follow-up, sooner if worsening.

## 2020-02-02 NOTE — Patient Instructions (Signed)
Neuropathic Pain Neuropathic pain is pain caused by damage to the nerves that are responsible for certain sensations in your body (sensory nerves). The pain can be caused by:  Damage to the sensory nerves that send signals to your spinal cord and brain (peripheral nervous system).  Damage to the sensory nerves in your brain or spinal cord (central nervous system). Neuropathic pain can make you more sensitive to pain. Even a minor sensation can feel very painful. This is usually a long-term condition that can be difficult to treat. The type of pain differs from person to person. It may:  Start suddenly (acute), or it may develop slowly and last for a long time (chronic).  Come and go as damaged nerves heal, or it may stay at the same level for years.  Cause emotional distress, loss of sleep, and a lower quality of life. What are the causes? The most common cause of this condition is diabetes. Many other diseases and conditions can also cause neuropathic pain. Causes of neuropathic pain can be classified as:  Toxic. This is caused by medicines and chemicals. The most common cause of toxic neuropathic pain is damage from cancer treatments (chemotherapy).  Metabolic. This can be caused by: ? Diabetes. This is the most common disease that damages the nerves. ? Lack of vitamin B from long-term alcohol abuse.  Traumatic. Any injury that cuts, crushes, or stretches a nerve can cause damage and pain. A common example is feeling pain after losing an arm or leg (phantom limb pain).  Compression-related. If a sensory nerve gets trapped or compressed for a long period of time, the blood supply to the nerve can be cut off.  Vascular. Many blood vessel diseases can cause neuropathic pain by decreasing blood supply and oxygen to nerves.  Autoimmune. This type of pain results from diseases in which the body's defense system (immune system) mistakenly attacks sensory nerves. Examples of autoimmune diseases  that can cause neuropathic pain include lupus and multiple sclerosis.  Infectious. Many types of viral infections can damage sensory nerves and cause pain. Shingles infection is a common cause of this type of pain.  Inherited. Neuropathic pain can be a symptom of many diseases that are passed down through families (genetic). What increases the risk? You are more likely to develop this condition if:  You have diabetes.  You smoke.  You drink too much alcohol.  You are taking certain medicines, including medicines that kill cancer cells (chemotherapy) or that treat immune system disorders. What are the signs or symptoms? The main symptom is pain. Neuropathic pain is often described as:  Burning.  Shock-like.  Stinging.  Hot or cold.  Itching. How is this diagnosed? No single test can diagnose neuropathic pain. It is diagnosed based on:  Physical exam and your symptoms. Your health care provider will ask you about your pain. You may be asked to use a pain scale to describe how bad your pain is.  Tests. These may be done to see if you have a high sensitivity to pain and to help find the cause and location of any sensory nerve damage. They include: ? Nerve conduction studies to test how well nerve signals travel through your sensory nerves (electrodiagnostic testing). ? Stimulating your sensory nerves through electrodes on your skin and measuring the response in your spinal cord and brain (somatosensory evoked potential).  Imaging studies, such as: ? X-rays. ? CT scan. ? MRI. How is this treated? Treatment for neuropathic pain may change   over time. You may need to try different treatment options or a combination of treatments. Some options include:  Treating the underlying cause of the neuropathy, such as diabetes, kidney disease, or vitamin deficiencies.  Stopping medicines that can cause neuropathy, such as chemotherapy.  Medicine to relieve pain. Medicines may  include: ? Prescription or over-the-counter pain medicine. ? Anti-seizure medicine. ? Antidepressant medicines. ? Pain-relieving patches that are applied to painful areas of skin. ? A medicine to numb the area (local anesthetic), which can be injected as a nerve block.  Transcutaneous nerve stimulation. This uses electrical currents to block painful nerve signals. The treatment is painless.  Alternative treatments, such as: ? Acupuncture. ? Meditation. ? Massage. ? Physical therapy. ? Pain management programs. ? Counseling. Follow these instructions at home: Medicines   Take over-the-counter and prescription medicines only as told by your health care provider.  Do not drive or use heavy machinery while taking prescription pain medicine.  If you are taking prescription pain medicine, take actions to prevent or treat constipation. Your health care provider may recommend that you: ? Drink enough fluid to keep your urine pale yellow. ? Eat foods that are high in fiber, such as fresh fruits and vegetables, whole grains, and beans. ? Limit foods that are high in fat and processed sugars, such as fried or sweet foods. ? Take an over-the-counter or prescription medicine for constipation. Lifestyle   Have a good support system at home.  Consider joining a chronic pain support group.  Do not use any products that contain nicotine or tobacco, such as cigarettes and e-cigarettes. If you need help quitting, ask your health care provider.  Do not drink alcohol. General instructions  Learn as much as you can about your condition.  Work closely with all your health care providers to find the treatment plan that works best for you.  Ask your health care provider what activities are safe for you.  Keep all follow-up visits as told by your health care provider. This is important. Contact a health care provider if:  Your pain treatments are not working.  You are having side effects  from your medicines.  You are struggling with tiredness (fatigue), mood changes, depression, or anxiety. Summary  Neuropathic pain is pain caused by damage to the nerves that are responsible for certain sensations in your body (sensory nerves).  Neuropathic pain may come and go as damaged nerves heal, or it may stay at the same level for years.  Neuropathic pain is usually a long-term condition that can be difficult to treat. Consider joining a chronic pain support group. This information is not intended to replace advice given to you by your health care provider. Make sure you discuss any questions you have with your health care provider. Document Revised: 06/05/2018 Document Reviewed: 03/01/2017 Elsevier Patient Education  2020 [employer]  

## 2020-02-08 ENCOUNTER — Ambulatory Visit
Admission: RE | Admit: 2020-02-08 | Discharge: 2020-02-08 | Disposition: A | Discharge status: Home / Self Care | Payer: 59 | Source: Ambulatory Visit | Attending: Neurosurgery | Admitting: Neurosurgery

## 2020-02-08 ENCOUNTER — Other Ambulatory Visit: Payer: Self-pay

## 2020-02-08 DIAGNOSIS — R200 Anesthesia of skin: Secondary | ICD-10-CM | POA: Diagnosis present

## 2020-02-08 IMAGING — MR MR THORACIC SPINE W/O CM
6 series · 30 of 48 positions shown · non-contrast
Comparison: None.

CLINICAL DATA: Bilateral finger, leg and foot numbness.

EXAM:
MRI CERVICAL AND THORACIC SPINE WITHOUT CONTRAST
TECHNIQUE: Multiplanar and multiecho pulse sequences of the cervical spine, to
include the craniocervical junction and cervicothoracic junction,
and the thoracic spine, were obtained without intravenous contrast.

[Series 16: T1 · sagittal · 5.0mm · 1.88mm/px · 2 of 9 slices shown (1 of 2)]
[im 1/9]
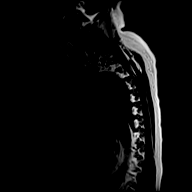
[im 9/9]
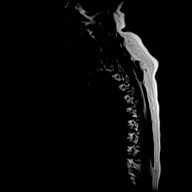

[Series 17: T2 · sagittal · 3.0mm · 1.06mm/px · 6 of 17 slices shown (1 of 2)]
[im 1/17]
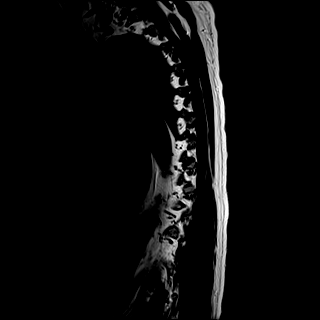
[im 4/17]
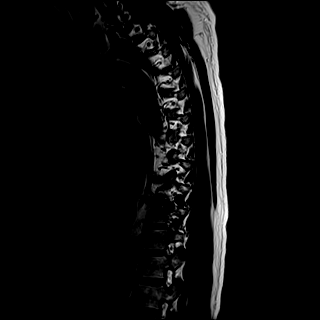
[im 7/17]
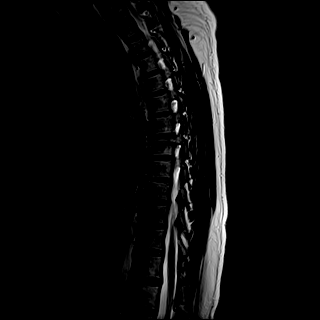
[im 10/17]
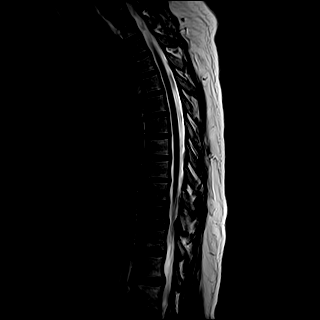
[im 13/17]
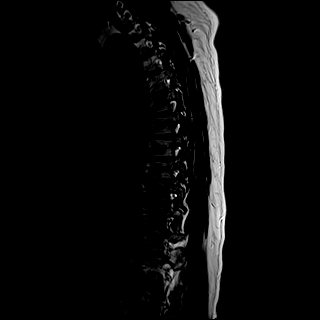
[im 17/17]
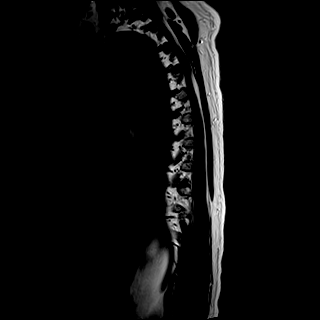

[Series 18: T1 · sagittal · 3.0mm · 1.06mm/px · 6 of 17 slices shown (2 of 2)]
[im 1/17]
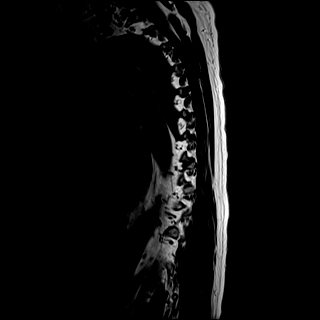
[im 4/17]
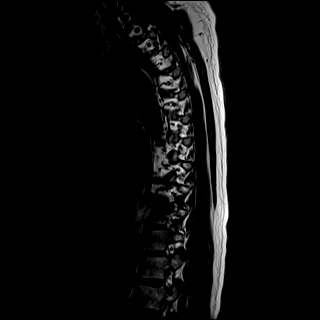
[im 7/17]
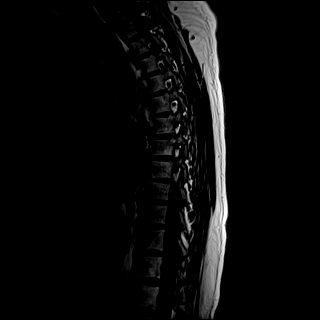
[im 10/17]
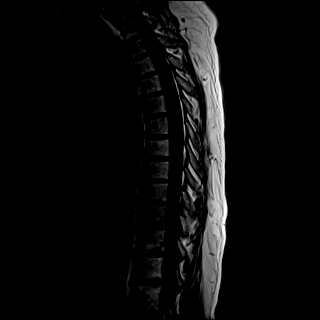
[im 13/17]
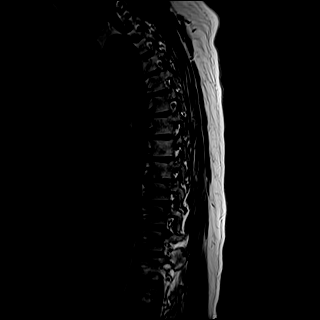
[im 17/17]
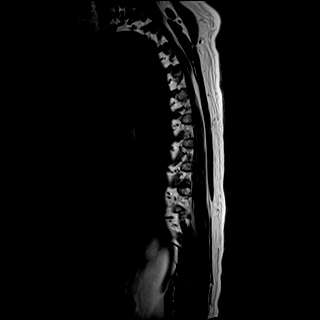

[Series 19: STIR · sagittal · 3.0mm · 0.53mm/px · 6 of 17 slices shown]
[im 1/17]
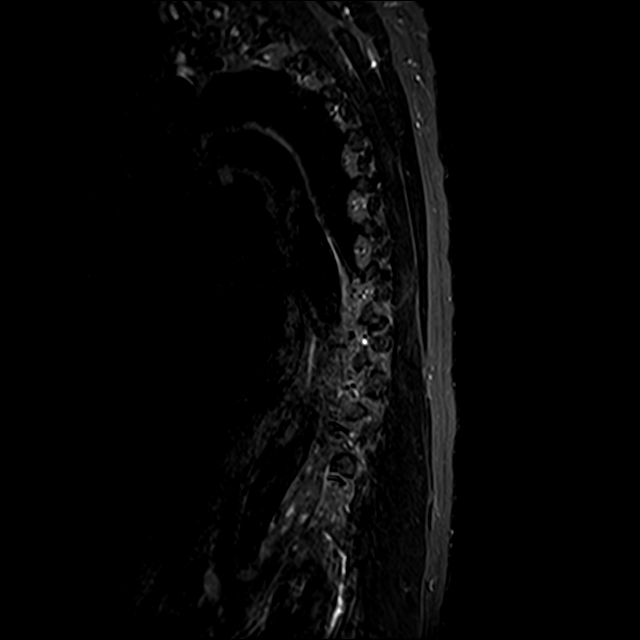
[im 4/17]
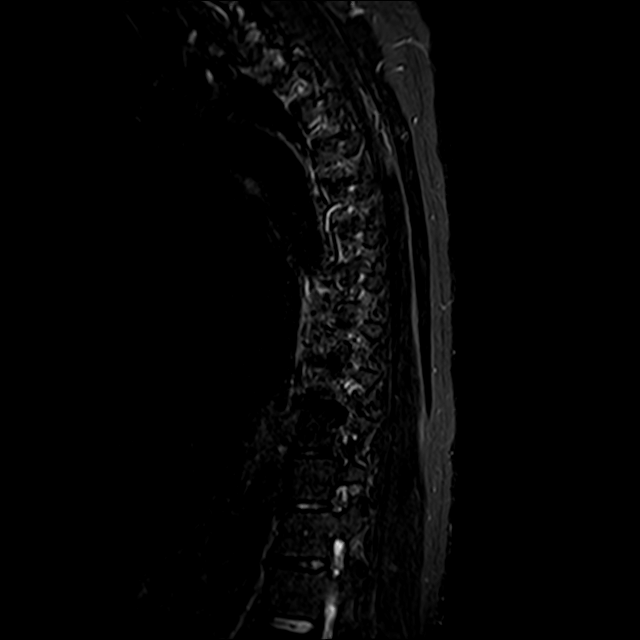
[im 7/17]
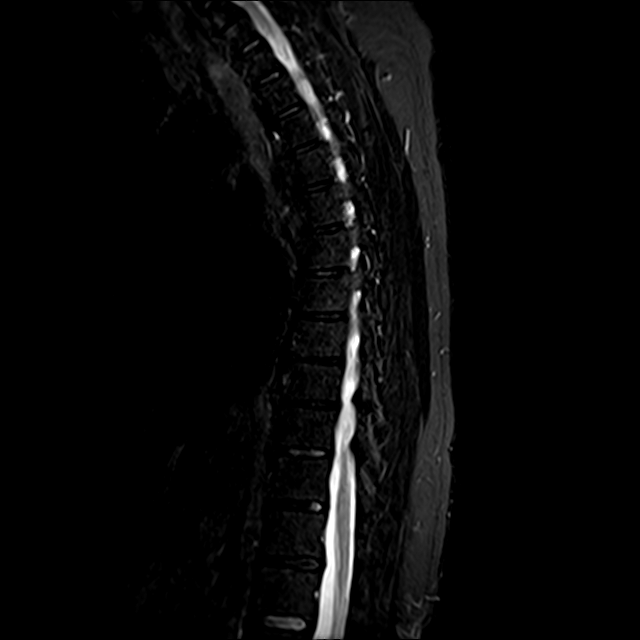
[im 10/17]
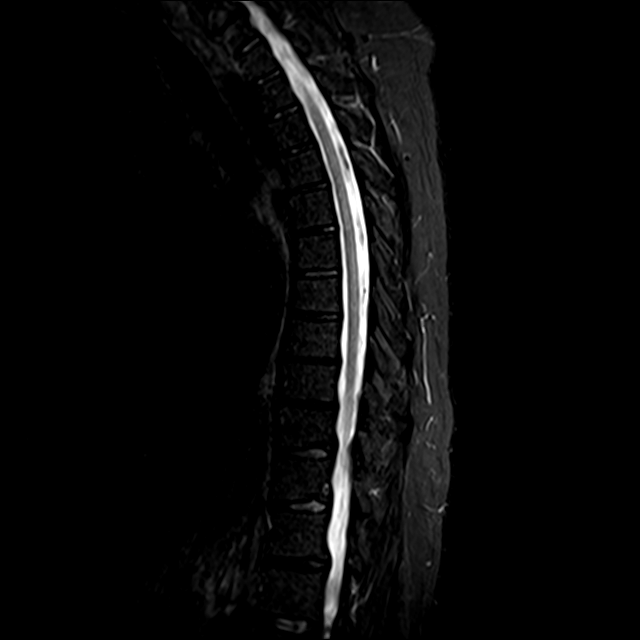
[im 13/17]
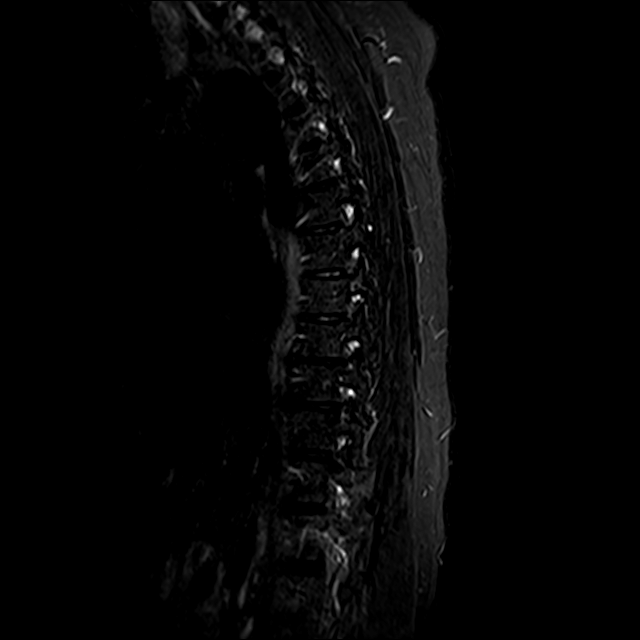
[im 17/17]
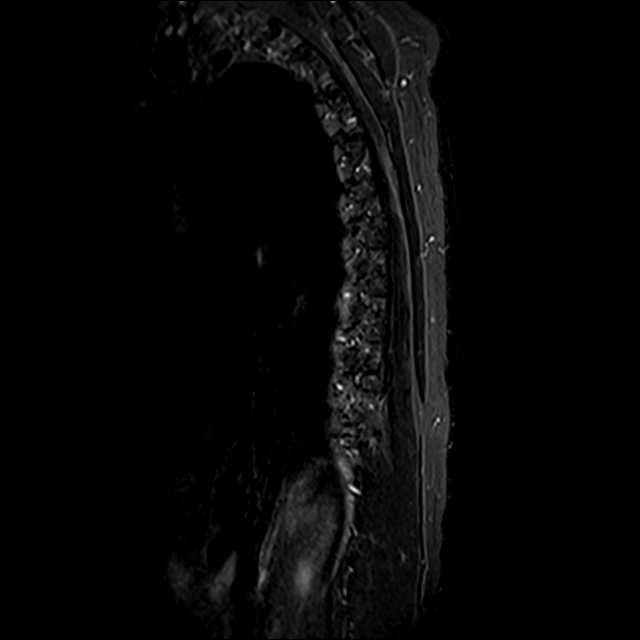

[Series 20: T2 · axial · 4.0mm · 0.59mm/px · z∈[-380,-145]mm · 8 of 39 slices shown (2 of 2)]
[im 1/39]
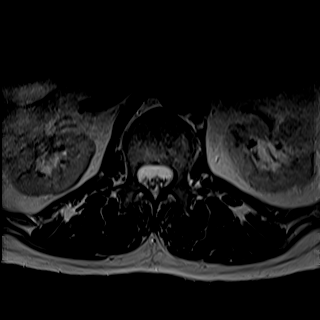
[im 6/39]
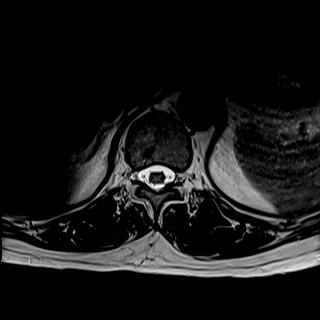
[im 12/39]
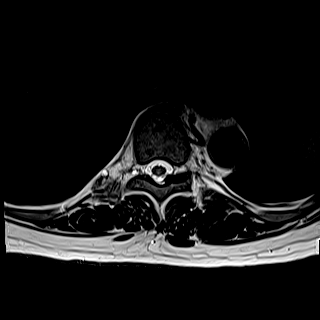
[im 18/39]
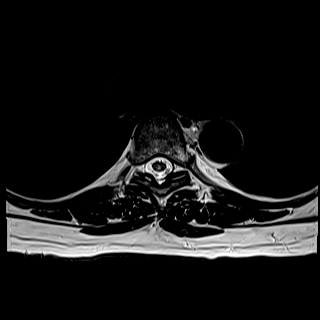
[im 21/39]
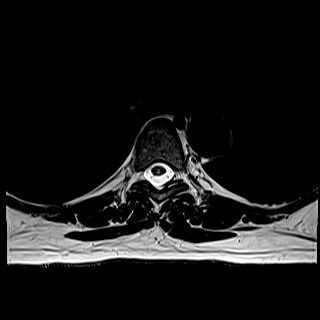
[im 27/39]
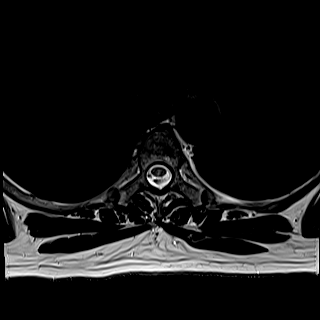
[im 33/39]
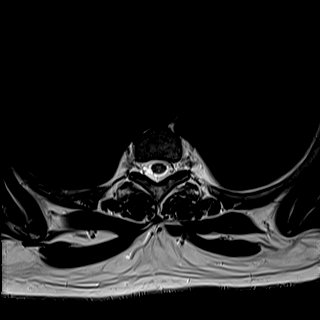
[im 39/39]
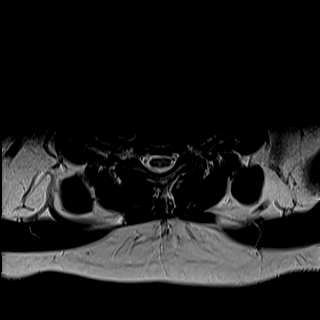

[Series 21: GRE · axial · 4.0mm · 0.37mm/px · z∈[-380,-342]mm · 2 of 39 slices shown]
[im 1/39]
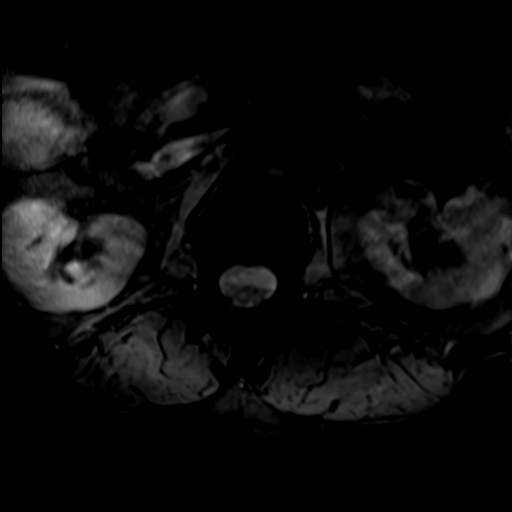
[im 6/39]
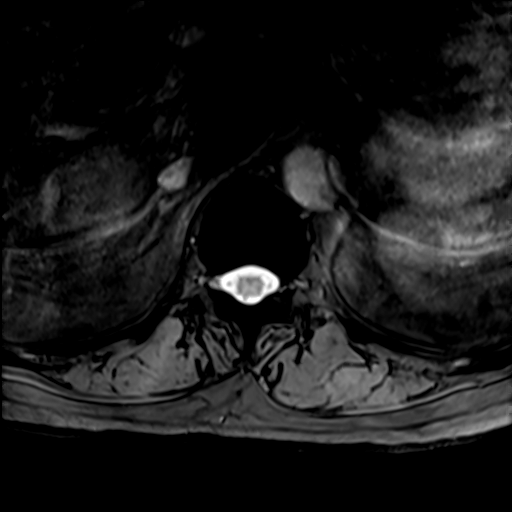

[30 of 48 positions shown; findings below may reference images not displayed]

FINDINGS: MRI CERVICAL SPINE FINDINGS

Alignment: Physiologic.

Vertebrae: No fracture, evidence of discitis, or bone lesion.

Cord: Normal signal and morphology.

Posterior Fossa, vertebral arteries, paraspinal tissues: Negative.

Disc levels:

The disc levels above C6 are normal.

C6-7: Small disc bulge with bilateral uncovertebral hypertrophy.
Mild spinal canal stenosis with moderate right and mild left neural
foraminal stenosis.

MRI THORACIC SPINE FINDINGS

Alignment:  Physiologic.

Vertebrae: No fracture, evidence of discitis, or bone lesion.

Cord:  Normal signal and morphology.

Paraspinal and other soft tissues: Negative.

Disc levels:

No spinal canal or neural foraminal stenosis.
IMPRESSION: 1. Mild spinal canal stenosis and moderate right, mild left neural
foraminal stenosis at C6-7.
2. Otherwise normal MRI of the cervical and thoracic spine.

## 2020-02-08 IMAGING — MR MR CERVICAL SPINE W/O CM
5 series · 40 of 48 positions shown · non-contrast
Comparison: None.

CLINICAL DATA: Bilateral finger, leg and foot numbness.

EXAM:
MRI CERVICAL AND THORACIC SPINE WITHOUT CONTRAST
TECHNIQUE: Multiplanar and multiecho pulse sequences of the cervical spine, to
include the craniocervical junction and cervicothoracic junction,
and the thoracic spine, were obtained without intravenous contrast.

[Series 26: T2 · sagittal · 3.0mm · 0.62mm/px · 6 of 15 slices shown (1 of 2)]
[im 1/15]
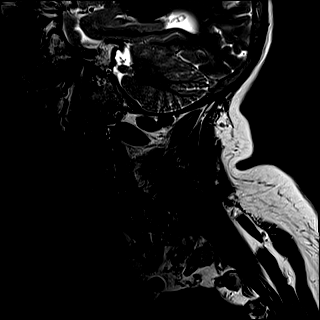
[im 3/15]
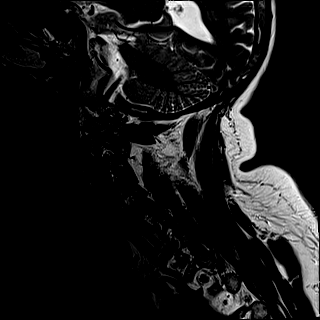
[im 6/15]
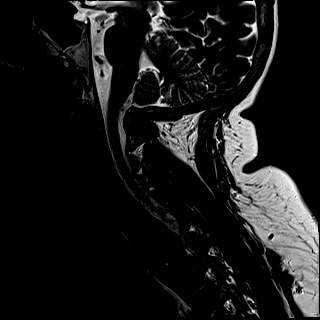
[im 9/15]
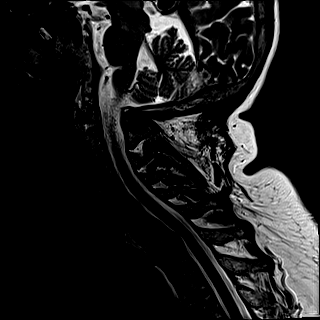
[im 12/15]
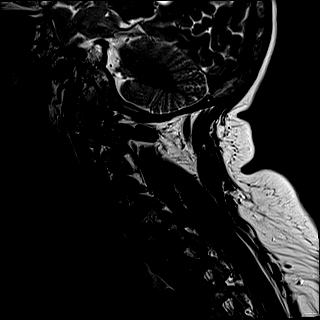
[im 15/15]
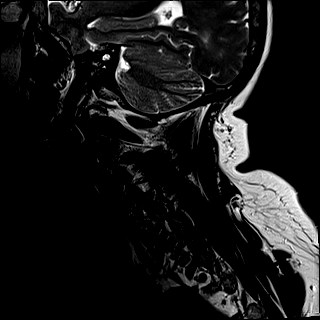

[Series 27: FLAIR · sagittal · 3.0mm · 0.78mm/px · 7 of 15 slices shown]
[im 1/15]
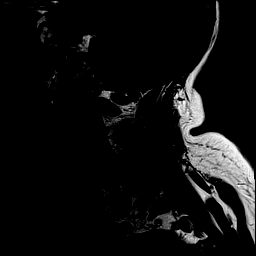
[im 3/15]
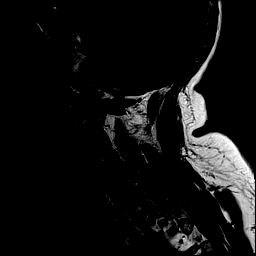
[im 5/15]
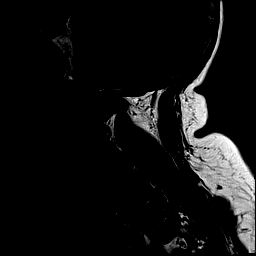
[im 8/15]
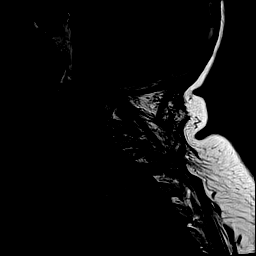
[im 10/15]
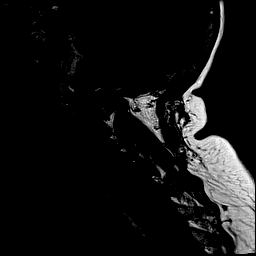
[im 12/15]
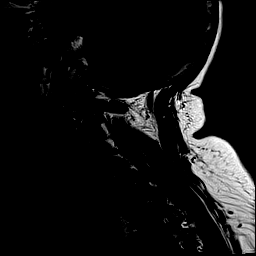
[im 15/15]
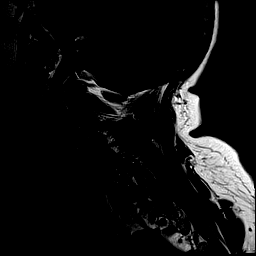

[Series 28: STIR · sagittal · 3.0mm · 0.62mm/px · 7 of 15 slices shown]
[im 1/15]
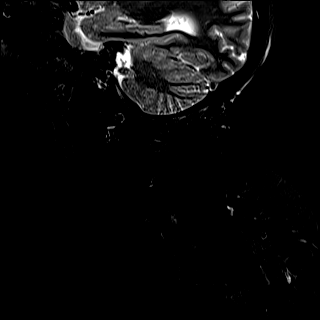
[im 3/15]
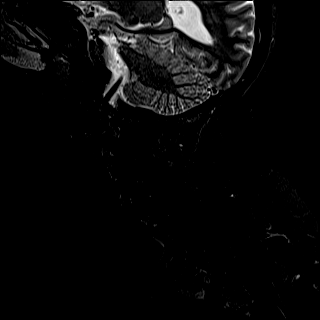
[im 5/15]
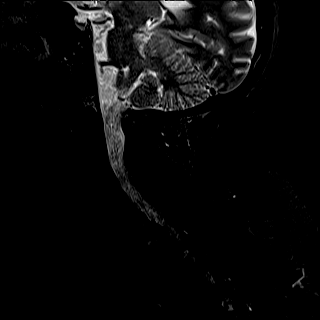
[im 8/15]
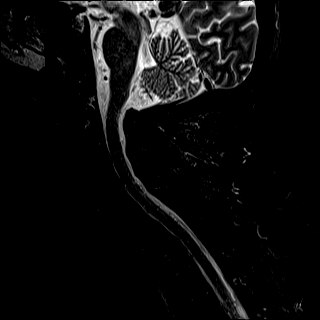
[im 10/15]
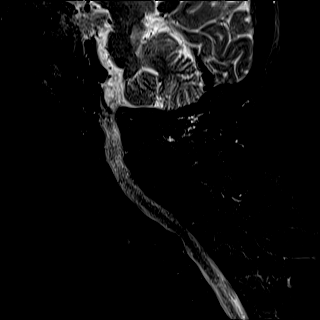
[im 12/15]
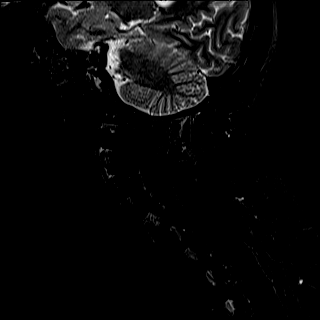
[im 15/15]
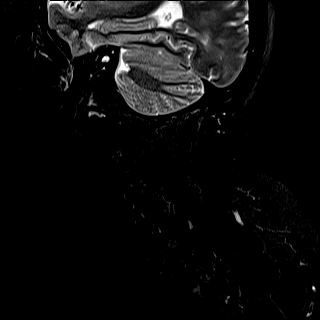

[Series 29: T2 · axial · 3.0mm · 0.70mm/px · z∈[-155,-66]mm · 12 of 29 slices shown (2 of 2)]
[im 1/29]
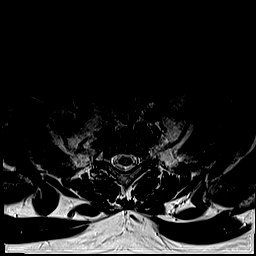
[im 3/29]
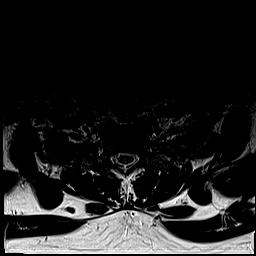
[im 5/29]
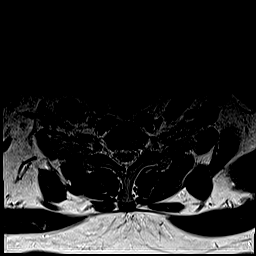
[im 7/29]
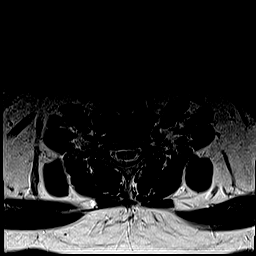
[im 9/29]
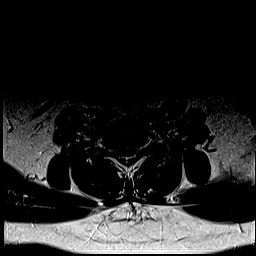
[im 11/29]
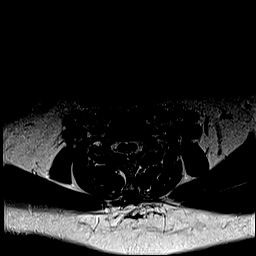
[im 13/29]
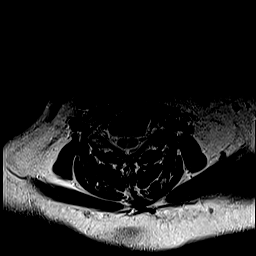
[im 16/29]
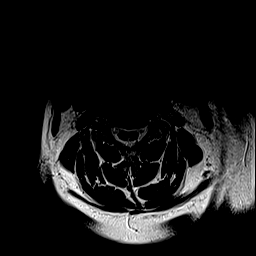
[im 18/29]
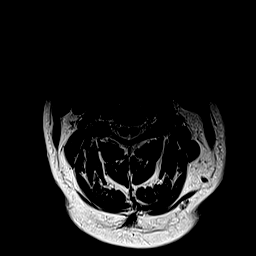
[im 20/29]
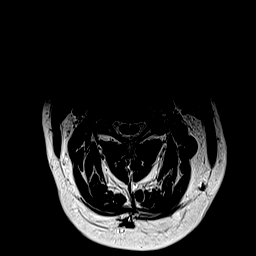
[im 24/29]
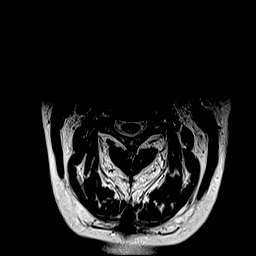
[im 29/29]
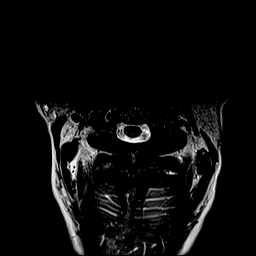

[Series 30: ax mpgr · axial · 3.0mm · 0.35mm/px · z∈[-155,-66]mm · 8 of 29 slices shown]
[im 1/29]
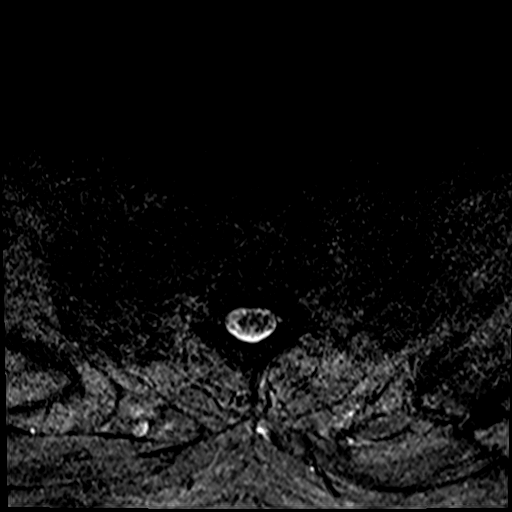
[im 5/29]
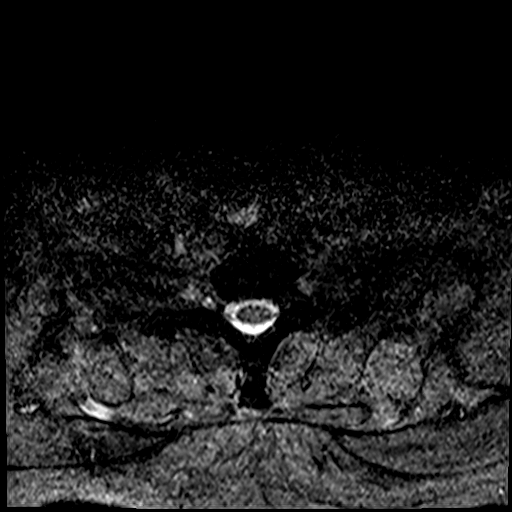
[im 9/29]
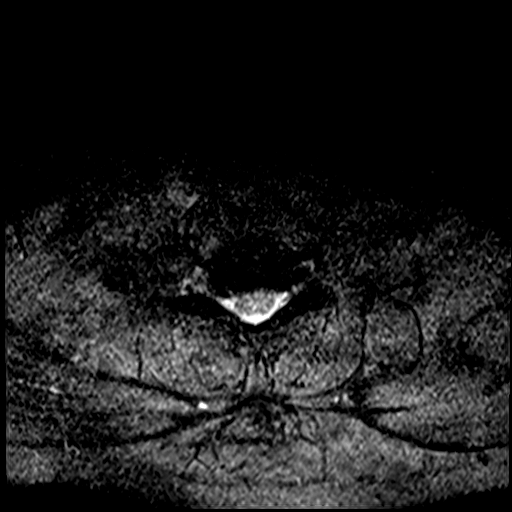
[im 13/29]
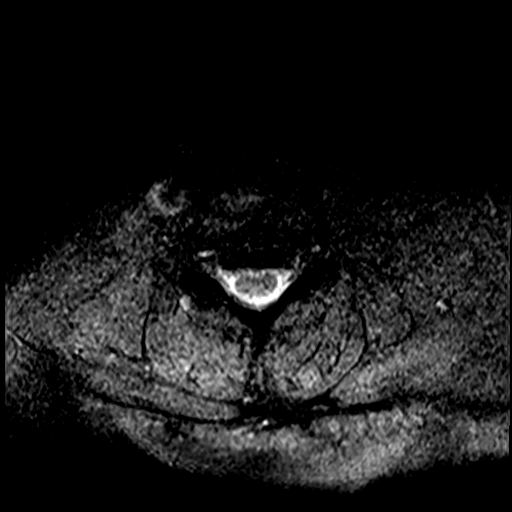
[im 16/29]
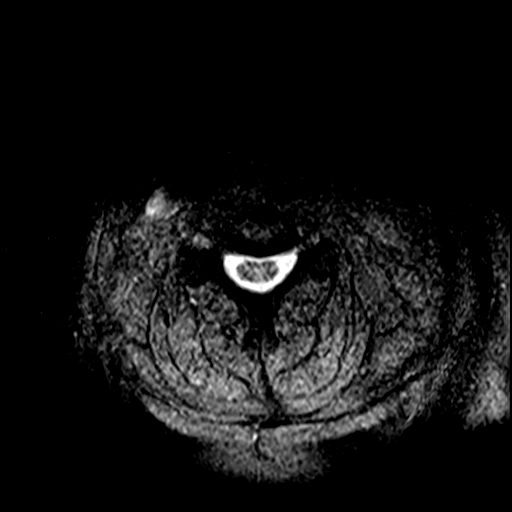
[im 20/29]
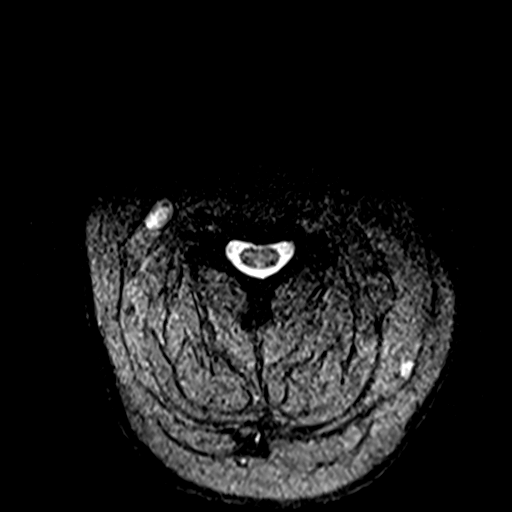
[im 24/29]
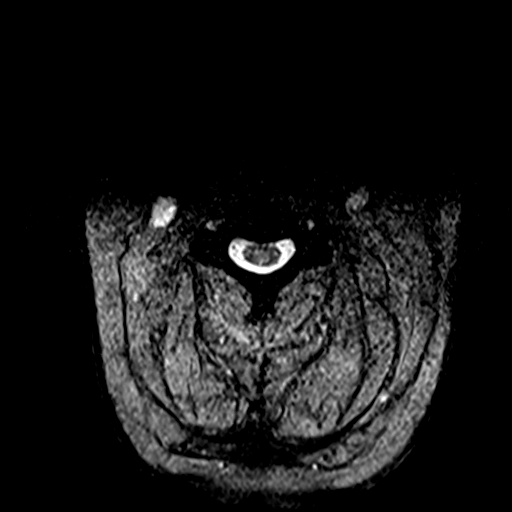
[im 29/29]
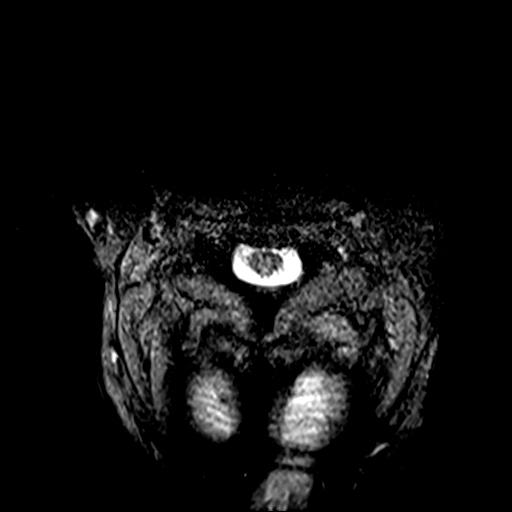

[40 of 48 positions shown; findings below may reference images not displayed]

FINDINGS: MRI CERVICAL SPINE FINDINGS

Alignment: Physiologic.

Vertebrae: No fracture, evidence of discitis, or bone lesion.

Cord: Normal signal and morphology.

Posterior Fossa, vertebral arteries, paraspinal tissues: Negative.

Disc levels:

The disc levels above C6 are normal.

C6-7: Small disc bulge with bilateral uncovertebral hypertrophy.
Mild spinal canal stenosis with moderate right and mild left neural
foraminal stenosis.

MRI THORACIC SPINE FINDINGS

Alignment:  Physiologic.

Vertebrae: No fracture, evidence of discitis, or bone lesion.

Cord:  Normal signal and morphology.

Paraspinal and other soft tissues: Negative.

Disc levels:

No spinal canal or neural foraminal stenosis.
IMPRESSION: 1. Mild spinal canal stenosis and moderate right, mild left neural
foraminal stenosis at C6-7.
2. Otherwise normal MRI of the cervical and thoracic spine.

## 2020-02-17 ENCOUNTER — Ambulatory Visit: Payer: 59 | Admitting: Neurology

## 2020-02-28 ENCOUNTER — Other Ambulatory Visit: Payer: Self-pay | Admitting: Nurse Practitioner

## 2020-03-11 ENCOUNTER — Other Ambulatory Visit: Payer: Self-pay | Admitting: Family Medicine

## 2020-04-05 ENCOUNTER — Ambulatory Visit: Payer: 59 | Admitting: Nurse Practitioner

## 2020-06-27 ENCOUNTER — Other Ambulatory Visit: Payer: Self-pay | Admitting: Nurse Practitioner

## 2020-08-19 ENCOUNTER — Ambulatory Visit (INDEPENDENT_AMBULATORY_CARE_PROVIDER_SITE_OTHER): Payer: 59 | Admitting: Nurse Practitioner

## 2020-08-19 ENCOUNTER — Encounter: Payer: Self-pay | Admitting: Nurse Practitioner

## 2020-08-19 ENCOUNTER — Other Ambulatory Visit: Payer: Self-pay

## 2020-08-19 VITALS — BP 109/73 | HR 72 | Temp 98.4°F | Wt 183.8 lb

## 2020-08-19 DIAGNOSIS — G608 Other hereditary and idiopathic neuropathies: Secondary | ICD-10-CM | POA: Insufficient documentation

## 2020-08-19 DIAGNOSIS — R7309 Other abnormal glucose: Secondary | ICD-10-CM | POA: Diagnosis not present

## 2020-08-19 DIAGNOSIS — G629 Polyneuropathy, unspecified: Secondary | ICD-10-CM | POA: Diagnosis not present

## 2020-08-19 NOTE — Assessment & Plan Note (Signed)
Chronic, ongoing.   Was diagnosed by neurology, with severe chronic sensory polyneuropathy.  Would like second opinion on treatment as is fearful of being w/c bound like mother who had similar, ?genetic element.  Will place referral to [facility] neuromuscular clinic for second opinion.  Repeat labs today CBC, CMP, TSH, B12.  Return in 6 months, sooner if worsening.

## 2020-08-19 NOTE — Progress Notes (Signed)
BP 109/73   Pulse 72   Temp 98.4 F (36.9 C) (Oral)   Wt 183 lb 12.8 oz (83.4 kg)   LMP 01/16/2010 (Approximate)   SpO2 93%   BMI 29.93 kg/m    Subjective:    Patient ID: Jo Martinez, female    DOB: 02/08/1961, 60 y.o.   MRN: [id number]  HPI: Jo Martinez is a 60 y.o. female  Chief Complaint  Patient presents with   Peripheral Neuropathy    Patient states she has some questions regarding neuropathy to discuss with provider. Patient states she is here to have repeat lab work as she had some low reading at last visit with provider.   NEUROPATHY She was seen by neurology 03/23/20 and had EMG in February -- was reported to have chronic severe sensory polyneuropathy in the legs.  Her mother had similar issues, she was 38 when she passed away. Not driving car right now as can not feel pedals -- her husband drives her.  She is concerned about being like her mother who was wheel chair bound. She did have lumbar spondylosis noted on MRI, saw neurosurgery and no surgery.  Last A1c 6.3% in October.  She states that neurology told her there was nothing they could do to help her neuropathy.  Her preference is not to take medication unless she has to. Neuropathy status: stable  Satisfied with current treatment?: yes Location: numbness to hands and feet Pain: no Severity: mild  Quality:  tingling and pins and needles Frequency: constant Bilateral: yes Symmetric: yes Numbness: yes Decreased sensation: yes Weakness: no Context: fluctuating Alleviating factors: nothing Aggravating factors: nothing Treatments attempted: nothing  Relevant past medical, surgical, family and social history reviewed and updated as indicated. Interim medical history since our last visit reviewed. Allergies and medications reviewed and updated.  Review of Systems  Constitutional:  Negative for activity change, appetite change, diaphoresis, fatigue and fever.  Respiratory:  Negative for cough, chest  tightness, shortness of breath and wheezing.   Cardiovascular:  Negative for chest pain, palpitations and leg swelling.  Gastrointestinal: Negative.   Endocrine: Negative for cold intolerance and heat intolerance.  Neurological:  Positive for numbness. Negative for dizziness, syncope, weakness, light-headedness and headaches.  Psychiatric/Behavioral: Negative.     Per HPI unless specifically indicated above     Objective:    BP 109/73   Pulse 72   Temp 98.4 F (36.9 C) (Oral)   Wt 183 lb 12.8 oz (83.4 kg)   LMP 01/16/2010 (Approximate)   SpO2 93%   BMI 29.93 kg/m   Wt Readings from Last 3 Encounters:  08/19/20 183 lb 12.8 oz (83.4 kg)  02/02/20 185 lb 3.2 oz (84 kg)  12/22/19 183 lb 12.8 oz (83.4 kg)    Physical Exam Vitals and nursing note reviewed.  Constitutional:      General: She is awake. She is not in acute distress.    Appearance: She is well-developed and well-groomed. She is obese. She is not ill-appearing.  HENT:     Head: Normocephalic.     Right Ear: Hearing normal.     Left Ear: Hearing normal.  Eyes:     General: Lids are normal.        Right eye: No discharge.        Left eye: No discharge.     Conjunctiva/sclera: Conjunctivae normal.     Pupils: Pupils are equal, round, and reactive to light.  Neck:     Thyroid:  No thyromegaly.     Vascular: No carotid bruit.  Cardiovascular:     Rate and Rhythm: Normal rate and regular rhythm.     Heart sounds: Normal heart sounds. No murmur heard.   No gallop.  Pulmonary:     Effort: Pulmonary effort is normal. No accessory muscle usage or respiratory distress.     Breath sounds: Normal breath sounds.  Abdominal:     General: Bowel sounds are normal.     Palpations: Abdomen is soft.  Musculoskeletal:     Cervical back: Normal range of motion and neck supple.     Right lower leg: No edema.     Left lower leg: No edema.     Right foot: Normal range of motion.     Left foot: Normal range of motion.  Feet:      Right foot:     Protective Sensation: 2 sites tested.  10 sites sensed.     Left foot:     Protective Sensation: 4 sites tested.  10 sites sensed.  Lymphadenopathy:     Cervical: No cervical adenopathy.  Skin:    General: Skin is warm and dry.  Neurological:     Mental Status: She is alert and oriented to person, place, and time.     Cranial Nerves: Cranial nerves are intact.     Sensory: Sensory deficit present.     Motor: Motor function is intact.     Coordination: Coordination is intact.     Gait: Gait is intact.     Deep Tendon Reflexes: Reflexes are normal and symmetric.     Reflex Scores:      Brachioradialis reflexes are 2+ on the right side and 2+ on the left side.      Patellar reflexes are 2+ on the right side and 2+ on the left side.    Comments: Sensation decreased bilateral feet.  Psychiatric:        Attention and Perception: Attention normal.        Mood and Affect: Mood normal.        Speech: Speech normal.        Behavior: Behavior normal. Behavior is cooperative.        Thought Content: Thought content normal.    Results for orders placed or performed in visit on 12/22/19  Lipid Panel w/o Chol/HDL Ratio  Result Value Ref Range   Cholesterol, Total 292 (H) 100 - 199 mg/dL   Triglycerides 273 (H) 0 - 149 mg/dL   HDL 41 >39 mg/dL   VLDL Cholesterol Cal 54 (H) 5 - 40 mg/dL   LDL Chol Calc (NIH) 197 (H) 0 - 99 mg/dL  B12 and Folate Panel  Result Value Ref Range   Vitamin B-12 616 232 - 1,245 pg/mL   Folate 18.6 >3.0 ng/mL  Iron, TIBC and Ferritin Panel  Result Value Ref Range   Total Iron Binding Capacity 294 250 - 450 ug/dL   UIBC 193 131 - 425 ug/dL   Iron 101 27 - 159 ug/dL   Iron Saturation 34 15 - 55 %   Ferritin 749 (H) 15 - 150 ng/mL  CBC with Differential/Platelet  Result Value Ref Range   WBC 9.3 3.4 - 10.8 x10E3/uL   RBC 4.48 3.77 - 5.28 x10E6/uL   Hemoglobin 14.0 11.1 - 15.9 g/dL   Hematocrit 41.3 34.0 - 46.6 %   MCV 92 79 - 97 fL   MCH  31.3 26.6 - 33.0 pg  MCHC 33.9 31.5 - 35.7 g/dL   RDW 13.5 11.7 - 15.4 %   Platelets 299 150 - 450 x10E3/uL   Neutrophils 50 Not Estab. %   Lymphs 41 Not Estab. %   Monocytes 7 Not Estab. %   Eos 1 Not Estab. %   Basos 1 Not Estab. %   Neutrophils Absolute 4.6 1.4 - 7.0 x10E3/uL   Lymphocytes Absolute 3.8 (H) 0.7 - 3.1 x10E3/uL   Monocytes Absolute 0.7 0.1 - 0.9 x10E3/uL   EOS (ABSOLUTE) 0.1 0.0 - 0.4 x10E3/uL   Basophils Absolute 0.1 0.0 - 0.2 x10E3/uL   Immature Granulocytes 0 Not Estab. %   Immature Grans (Abs) 0.0 0.0 - 0.1 x10E3/uL  Comprehensive metabolic panel  Result Value Ref Range   Glucose 121 (H) 65 - 99 mg/dL   BUN 15 6 - 24 mg/dL   Creatinine, Ser 0.86 0.57 - 1.00 mg/dL   GFR calc non Af Amer 75 >59 mL/min/1.73   GFR calc Af Amer 87 >59 mL/min/1.73   BUN/Creatinine Ratio 17 9 - 23   Sodium 140 134 - 144 mmol/L   Potassium 3.8 3.5 - 5.2 mmol/L   Chloride 101 96 - 106 mmol/L   CO2 23 20 - 29 mmol/L   Calcium 10.3 (H) 8.7 - 10.2 mg/dL   Total Protein 6.9 6.0 - 8.5 g/dL   Albumin 4.9 3.8 - 4.9 g/dL   Globulin, Total 2.0 1.5 - 4.5 g/dL   Albumin/Globulin Ratio 2.5 (H) 1.2 - 2.2   Bilirubin Total 0.4 0.0 - 1.2 mg/dL   Alkaline Phosphatase 60 44 - 121 IU/L   AST 15 0 - 40 IU/L   ALT 13 0 - 32 IU/L  TSH  Result Value Ref Range   TSH 2.350 0.450 - 4.500 uIU/mL  Sed Rate (ESR)  Result Value Ref Range   Sed Rate 24 0 - 40 mm/hr  C-reactive protein  Result Value Ref Range   CRP 5 0 - 10 mg/L  HgB A1c  Result Value Ref Range   Hgb A1c MFr Bld 6.3 (H) 4.8 - 5.6 %   Est. average glucose Bld gHb Est-mCnc 134 mg/dL  RPR  Result Value Ref Range   RPR Ser Ql Non Reactive Non Reactive      Assessment & Plan:   Problem List Items Addressed This Visit       Nervous and Auditory   Polyneuropathy - Primary    Chronic, ongoing.   Was diagnosed by neurology, with severe chronic sensory polyneuropathy.  Would like second opinion on treatment as is fearful of being  w/c bound like mother who had similar, ?genetic element.  Will place referral to [facility] neuromuscular clinic for second opinion.  Repeat labs today CBC, CMP, TSH, B12.  Return in 6 months, sooner if worsening.       Relevant Orders   Comprehensive metabolic panel   CBC with Differential/Platelet   TSH   Vitamin B12   Ambulatory referral to Neurology     Other   Elevated hemoglobin A1c    Noted on recent labs at 6.3%, recheck today and initiate medication as needed.  Focus on diet and exercise at this time.       Relevant Orders   HgB A1c     Follow up plan: Return in about 6 months (around 02/18/2021) for Neuropathy and A1c check.

## 2020-08-19 NOTE — Patient Instructions (Signed)
Bradley and Daroff's neurology in clinical practice (8th ed., pp. 1853- 1929). Elsevier."> Goldman-Cecil medicine (26th ed., pp. 2489- 2501). Elsevier.">  Peripheral Neuropathy Peripheral neuropathy is a type of nerve damage. It affects nerves that carry signals between the spinal cord and the arms, legs, and the rest of the body (peripheral nerves). It does not affect nerves in the spinal cord or brain. In peripheral neuropathy, one nerve or a group of nerves may be damaged. Peripheral neuropathy is a broad category that includes many specific nerve disorders,like diabetic neuropathy, hereditary neuropathy, and carpal tunnel syndrome. What are the causes? This condition may be caused by: Diabetes. This is the most common cause of peripheral neuropathy. Nerve injury. Pressure or stress on a nerve that lasts a long time. Lack (deficiency) of B vitamins. This can result from alcoholism, poor diet, or a restricted diet. Infections. Autoimmune diseases, such as rheumatoid arthritis and systemic lupus erythematosus. Nerve diseases that are passed from parent to child (inherited). Some medicines, such as cancer medicines (chemotherapy). Poisonous (toxic) substances, such as lead and mercury. Too little blood flowing to the legs. Kidney disease. Thyroid disease. In some cases, the cause of this condition is not known. What are the signs or symptoms? Symptoms of this condition depend on which of your nerves is damaged. Common symptoms include: Loss of feeling (numbness) in the feet, hands, or both. Tingling in the feet, hands, or both. Burning pain. Very sensitive skin. Weakness. Not being able to move a part of the body (paralysis). Muscle twitching. Clumsiness or poor coordination. Loss of balance. Not being able to control your bladder. Feeling dizzy. Sexual problems. How is this diagnosed? Diagnosing and finding the cause of peripheral neuropathy can be difficult. Your health care  provider will take your medical history and do a physical exam. A neurological exam will also be done. This involves checking things that are affected by your brain, spinal cord, and nerves (nervous system). For example, your health care provider will check your reflexes, how youmove, and what you can feel. You may have other tests, such as: Blood tests. Electromyogram (EMG) and nerve conduction tests. These tests check nerve function and how well the nerves are controlling the muscles. Imaging tests, such as CT scans or MRI to rule out other causes of your symptoms. Removing a small piece of nerve to be examined in a lab (nerve biopsy). Removing and examining a small amount of the fluid that surrounds the brain and spinal cord (lumbar puncture). How is this treated? Treatment for this condition may involve: Treating the underlying cause of the neuropathy, such as diabetes, kidney disease, or vitamin deficiencies. Stopping medicines that can cause neuropathy, such as chemotherapy. Medicine to help relieve pain. Medicines may include: Prescription or over-the-counter pain medicine. Antiseizure medicine. Antidepressants. Pain-relieving patches that are applied to painful areas of skin. Surgery to relieve pressure on a nerve or to destroy a nerve that is causing pain. Physical therapy to help improve movement and balance. Devices to help you move around (assistive devices). Follow these instructions at home: Medicines Take over-the-counter and prescription medicines only as told by your health care provider. Do not take any other medicines without first asking your health care provider. Do not drive or use heavy machinery while taking prescription pain medicine. Lifestyle  Do not use any products that contain nicotine or tobacco, such as cigarettes and e-cigarettes. Smoking keeps blood from reaching damaged nerves. If you need help quitting, ask your health care provider. Avoid or limit    alcohol. Too much alcohol can cause a vitamin B deficiency, and vitamin B is needed for healthy nerves. Eat a healthy diet. This includes: Eating foods that are high in fiber, such as fresh fruits and vegetables, whole grains, and beans. Limiting foods that are high in fat and processed sugars, such as fried or sweet foods.  General instructions  If you have diabetes, work closely with your health care provider to keep your blood sugar under control. If you have numbness in your feet: Check every day for signs of injury or infection. Watch for redness, warmth, and swelling. Wear padded socks and comfortable shoes. These help protect your feet. Develop a good support system. Living with peripheral neuropathy can be stressful. Consider talking with a mental health specialist or joining a support group. Use assistive devices and attend physical therapy as told by your health care provider. This may include using a walker or a cane. Keep all follow-up visits as told by your health care provider. This is important.  Contact a health care provider if: You have new signs or symptoms of peripheral neuropathy. You are struggling emotionally from dealing with peripheral neuropathy. Your pain is not well-controlled. Get help right away if: You have an injury or infection that is not healing normally. You develop new weakness in an arm or leg. You have fallen or do so frequently. Summary Peripheral neuropathy is when the nerves in the arms, or legs are damaged, resulting in numbness, weakness, or pain. There are many causes of peripheral neuropathy, including diabetes, pinched nerves, vitamin deficiencies, autoimmune disease, and hereditary conditions. Diagnosing and finding the cause of peripheral neuropathy can be difficult. Your health care provider will take your medical history, do a physical exam, and do tests, including blood tests and nerve function tests. Treatment involves treating the  underlying cause of the neuropathy and taking medicines to help control pain. Physical therapy and assistive devices may also help. This information is not intended to replace advice given to you by your health care provider. Make sure you discuss any questions you have with your healthcare provider. Document Revised: 11/24/2019 Document Reviewed: 11/24/2019 Elsevier Patient Education  2022 Elsevier Inc.  

## 2020-08-19 NOTE — Assessment & Plan Note (Signed)
Noted on recent labs at 6.3%, recheck today and initiate medication as needed.  Focus on diet and exercise at this time.

## 2020-08-20 LAB — CBC WITH DIFFERENTIAL/PLATELET
Basophils Absolute: 0.1 10*3/uL (ref 0.0–0.2)
Basos: 1 %
EOS (ABSOLUTE): 0.2 10*3/uL (ref 0.0–0.4)
Eos: 2 %
Hematocrit: 40.5 % (ref 34.0–46.6)
Hemoglobin: 13.8 g/dL (ref 11.1–15.9)
Immature Grans (Abs): 0 10*3/uL (ref 0.0–0.1)
Immature Granulocytes: 0 %
Lymphocytes Absolute: 4.2 10*3/uL — ABNORMAL HIGH (ref 0.7–3.1)
Lymphs: 42 %
MCH: 31.7 pg (ref 26.6–33.0)
MCHC: 34.1 g/dL (ref 31.5–35.7)
MCV: 93 fL (ref 79–97)
Monocytes Absolute: 0.7 10*3/uL (ref 0.1–0.9)
Monocytes: 7 %
Neutrophils Absolute: 4.9 10*3/uL (ref 1.4–7.0)
Neutrophils: 48 %
Platelets: 328 10*3/uL (ref 150–450)
RBC: 4.35 x10E6/uL (ref 3.77–5.28)
RDW: 12.8 % (ref 11.7–15.4)
WBC: 10.1 10*3/uL (ref 3.4–10.8)

## 2020-08-20 LAB — HEMOGLOBIN A1C
Est. average glucose Bld gHb Est-mCnc: 143 mg/dL
Hgb A1c MFr Bld: 6.6 % — ABNORMAL HIGH (ref 4.8–5.6)

## 2020-08-20 LAB — COMPREHENSIVE METABOLIC PANEL
ALT: 11 IU/L (ref 0–32)
AST: 13 IU/L (ref 0–40)
Albumin/Globulin Ratio: 2.4 — ABNORMAL HIGH (ref 1.2–2.2)
Albumin: 4.8 g/dL (ref 3.8–4.9)
Alkaline Phosphatase: 70 IU/L (ref 44–121)
BUN/Creatinine Ratio: 16 (ref 9–23)
BUN: 14 mg/dL (ref 6–24)
Bilirubin Total: 0.3 mg/dL (ref 0.0–1.2)
CO2: 25 mmol/L (ref 20–29)
Calcium: 10.8 mg/dL — ABNORMAL HIGH (ref 8.7–10.2)
Chloride: 98 mmol/L (ref 96–106)
Creatinine, Ser: 0.87 mg/dL (ref 0.57–1.00)
Globulin, Total: 2 g/dL (ref 1.5–4.5)
Glucose: 96 mg/dL (ref 65–99)
Potassium: 3.9 mmol/L (ref 3.5–5.2)
Sodium: 138 mmol/L (ref 134–144)
Total Protein: 6.8 g/dL (ref 6.0–8.5)
eGFR: 77 mL/min/{1.73_m2} (ref >59)

## 2020-08-20 LAB — TSH: TSH: 2.83 u[IU]/mL (ref 0.450–4.500)

## 2020-08-20 LAB — VITAMIN B12: Vitamin B-12: >2000 pg/mL — ABNORMAL HIGH (ref 232–1245)

## 2020-08-20 NOTE — Progress Notes (Signed)
Contacted via [place] morning Kiran, your labs have returned: - Kidney function, creatinine and eGFR, is normal. Liver function, AST and ALT, is normal. - Calcium is elevated at 10.8, would like to recheck this in 6 weeks and have you schedule visit with me.  Do you take any calcium supplement or TUMS are home? If so hold these. - CBC overall is stable, no anemia - Thyroid normal - B12 level improved, you can reduce dosing to 500 MCG daily or take every other day. - A1c is my main concern, this has increased to diabetic range at 6.6%.  Any level > 6.5% is considered diabetes and this can worsen neuropathy discomfort.  I would like to start medication to help this.  We could start a low dose of Metformin by mouth or try a weekly injectable like Ozempic or Trulicity which can help with both diabetes and weight loss.  What are your thoughts?  Please schedule a 6 week follow-up with me and let me know your thoughts on this? Keep being awesome!!  Thank you for allowing me to participate in your care.  I appreciate you. Kindest regards, Wiliam Cauthorn

## 2020-08-23 ENCOUNTER — Telehealth: Payer: Self-pay

## 2020-08-23 NOTE — Telephone Encounter (Signed)
Left message for patient to give our office a call back to discuss previous message regarding referral.

## 2020-08-23 NOTE — Telephone Encounter (Signed)
Copied from CRM [id number]. Topic: Referral - Status >> Aug 23, 2020 10:36 AM Elliot Gault wrote: Rodney Booze from [facility] called to inform, patient  insurance is out of network and patient will be responsible for out of pocket cost. Specialist left message on patient voicemail. Call back # to the specialist  [phone]

## 2020-08-24 NOTE — Telephone Encounter (Signed)
Called pt to see if pt would be ok seeing [facility] in [place] per Jolene no answer left vm to call back

## 2020-08-26 NOTE — Telephone Encounter (Signed)
Called pt again no answer left vm 

## 2020-08-31 NOTE — Telephone Encounter (Signed)
FYI

## 2020-08-31 NOTE — Telephone Encounter (Signed)
Left message for patient to give our office a call back at her earliest.

## 2020-08-31 NOTE — Telephone Encounter (Signed)
Pt has called back and states she is NOT interested at this point and will CB if changes her mind. States she had left a message also in MyChart.

## 2020-08-31 NOTE — Telephone Encounter (Signed)
Noted  

## 2020-09-08 ENCOUNTER — Other Ambulatory Visit: Payer: Self-pay | Admitting: Nurse Practitioner

## 2020-10-14 MED ORDER — VALACYCLOVIR HCL 500 MG PO TABS
500.0000 mg | ORAL_TABLET | Freq: Every day | ORAL | 4 refills | Status: DC
Start: 2020-10-14 — End: 2021-01-25

## 2021-01-25 ENCOUNTER — Encounter: Payer: Self-pay | Admitting: Nurse Practitioner

## 2021-01-25 MED ORDER — VALACYCLOVIR HCL 500 MG PO TABS
500.0000 mg | ORAL_TABLET | Freq: Every day | ORAL | 4 refills | Status: DC
Start: 2021-01-25 — End: 2021-03-09

## 2021-02-18 DIAGNOSIS — E7800 Pure hypercholesterolemia, unspecified: Secondary | ICD-10-CM | POA: Insufficient documentation

## 2021-02-18 NOTE — Patient Instructions (Signed)

## 2021-02-21 ENCOUNTER — Other Ambulatory Visit: Payer: Self-pay

## 2021-02-21 ENCOUNTER — Ambulatory Visit (INDEPENDENT_AMBULATORY_CARE_PROVIDER_SITE_OTHER): Payer: 59 | Admitting: Nurse Practitioner

## 2021-02-21 ENCOUNTER — Encounter: Payer: Self-pay | Admitting: Nurse Practitioner

## 2021-02-21 VITALS — BP 120/80 | HR 91 | Temp 98.8°F | Ht 65.5 in | Wt 181.4 lb

## 2021-02-21 DIAGNOSIS — R7309 Other abnormal glucose: Secondary | ICD-10-CM | POA: Diagnosis not present

## 2021-02-21 DIAGNOSIS — I10 Essential (primary) hypertension: Secondary | ICD-10-CM | POA: Diagnosis not present

## 2021-02-21 DIAGNOSIS — E782 Mixed hyperlipidemia: Secondary | ICD-10-CM | POA: Diagnosis not present

## 2021-02-21 DIAGNOSIS — F325 Major depressive disorder, single episode, in full remission: Secondary | ICD-10-CM | POA: Diagnosis not present

## 2021-02-21 DIAGNOSIS — G629 Polyneuropathy, unspecified: Secondary | ICD-10-CM

## 2021-02-21 DIAGNOSIS — E559 Vitamin D deficiency, unspecified: Secondary | ICD-10-CM

## 2021-02-21 LAB — MICROALBUMIN, URINE WAIVED
Creatinine, Urine Waived: 50 mg/dL (ref 10–300)
Microalb, Ur Waived: 30 mg/L — ABNORMAL HIGH (ref 0–19)

## 2021-02-21 LAB — BAYER DCA HB A1C WAIVED: HB A1C (BAYER DCA - WAIVED): 6.5 % — ABNORMAL HIGH (ref 4.8–5.6)

## 2021-02-21 MED ORDER — LOSARTAN POTASSIUM-HCTZ 100-25 MG PO TABS
1.0000 | ORAL_TABLET | Freq: Every day | ORAL | 4 refills | Status: DC
Start: 2021-02-21 — End: 2021-03-09

## 2021-02-21 MED ORDER — FLUOXETINE HCL 20 MG PO CAPS: 20.0000 mg | ORAL_CAPSULE | Freq: Every day | ORAL | 4 refills | Status: DC

## 2021-02-21 NOTE — Assessment & Plan Note (Addendum)
Chronic, ongoing.   Was diagnosed by neurology, with severe chronic sensory polyneuropathy.  Would like second opinion on treatment as is fearful of being w/c bound like mother who had similar, ?genetic element.  Will place referral to [facility] neuromuscular clinic for second opinion in new year if covered by new insurance, old insurance did not cover.  Repeat labs today CMP.  She declines medication.  Return in 6 months, sooner if worsening.

## 2021-02-21 NOTE — Assessment & Plan Note (Signed)
Chronic, stable with BP at goal.  Reports she does get white coat syndrome in office. Recommend she monitor BP at least a few mornings a week at home and document.  DASH diet at home.  Continue current medication regimen and adjust as needed.  Labs today: CMP and urine ALB = 30.  Return in 6 months.

## 2021-02-21 NOTE — Progress Notes (Signed)
BP 120/80    Pulse 91    Temp 98.8 F (37.1 C) (Oral)    Ht 5' 5.5" (1.664 m)    Wt 181 lb 6.4 oz (82.3 kg)    LMP 01/16/2010 (Approximate)    SpO2 97%    BMI 29.73 kg/m    Subjective:    Patient ID: Jo Martinez, female    DOB: Mar 23, 1960, 60 y.o.   MRN: [id number]  HPI: Jo Martinez is a 60 y.o. female  Chief Complaint  Patient presents with   Peripheral Neuropathy    Patient states it is about the same and she has just learned to live with it.   A1c Check   Medication Refill    Patient requesting a refill on her medications at today's visit.    The 10-year ASCVD risk score (Arnett DK, et al., 2019) is: 5.6%   Values used to calculate the score:     Age: 27 years     Sex: Female     Is Non-Hispanic African American: No     Diabetic: No     Tobacco smoker: No     Systolic Blood Pressure: 884 mmHg     Is BP treated: Yes     HDL Cholesterol: 41 mg/dL     Total Cholesterol: 292 mg/dL   NEUROPATHY She was seen by neurology 03/23/20 and had EMG with them in February -- was reported to have chronic severe sensory polyneuropathy in the legs.  She states that neurology told her there was nothing they could do to help her neuropathy.  Her mother had similar issues.  Does not drive often as can not feel pedals -- her husband drives her on occasion.  She is concerned about being like her mother who was wheel chair bound and bed ridden. She did have lumbar spondylosis noted on MRI, saw neurosurgery and no surgery.  Last A1c 6.6% in June -- she has been focusing on diet changes, prefers not to take medication.  She prefers not to take medication unless needed.  Has developed some RLS with this, that she "just deals with". Neuropathy status: stable  Satisfied with current treatment?: yes Location: numbness to hands and feet Pain: no Severity: mild  Quality:  tingling and pins and needles Frequency: constant Bilateral: yes Symmetric: yes Numbness: yes Decreased sensation:  yes Weakness: no Context: fluctuating Alleviating factors: unknown Aggravating factors: unknown Treatments attempted: nothing Depression screen [facility] 2/9 02/21/2021 08/19/2020 02/02/2020 12/22/2019 03/06/2019  Decreased Interest 0 0 0 0 0  Down, Depressed, Hopeless 0 0 0 0 0  PHQ - 2 Score 0 0 0 0 0  Altered sleeping 0 1 - 1 0  Tired, decreased energy 0 0 - 0 0  Change in appetite 0 0 - 0 0  Feeling bad or failure about yourself  0 0 - 0 0  Trouble concentrating 0 0 - 0 0  Moving slowly or fidgety/restless 0 0 - 0 0  Suicidal thoughts 0 0 - 0 0  PHQ-9 Score 0 1 - 1 0  Difficult doing work/chores - Not difficult at all - Not difficult at all Not difficult at all  Some recent data might be hidden     Relevant past medical, surgical, family and social history reviewed and updated as indicated. Interim medical history since our last visit reviewed. Allergies and medications reviewed and updated.  Review of Systems  Constitutional:  Negative for activity change, appetite change, diaphoresis, fatigue and fever.  Respiratory:  Negative for cough, chest tightness, shortness of breath and wheezing.   Cardiovascular:  Negative for chest pain, palpitations and leg swelling.  Gastrointestinal: Negative.   Endocrine: Negative for cold intolerance and heat intolerance.  Neurological:  Positive for numbness. Negative for dizziness, syncope, weakness, light-headedness and headaches.  Psychiatric/Behavioral: Negative.     Per HPI unless specifically indicated above     Objective:    BP 120/80    Pulse 91    Temp 98.8 F (37.1 C) (Oral)    Ht 5' 5.5" (1.664 m)    Wt 181 lb 6.4 oz (82.3 kg)    LMP 01/16/2010 (Approximate)    SpO2 97%    BMI 29.73 kg/m   Wt Readings from Last 3 Encounters:  02/21/21 181 lb 6.4 oz (82.3 kg)  08/19/20 183 lb 12.8 oz (83.4 kg)  02/02/20 185 lb 3.2 oz (84 kg)    Physical Exam Vitals and nursing note reviewed.  Constitutional:      General: She is awake. She is  not in acute distress.    Appearance: She is well-developed and well-groomed. She is obese. She is not ill-appearing.  HENT:     Head: Normocephalic.     Right Ear: Hearing normal.     Left Ear: Hearing normal.  Eyes:     General: Lids are normal.        Right eye: No discharge.        Left eye: No discharge.     Conjunctiva/sclera: Conjunctivae normal.     Pupils: Pupils are equal, round, and reactive to light.  Neck:     Thyroid: No thyromegaly.     Vascular: No carotid bruit.  Cardiovascular:     Rate and Rhythm: Normal rate and regular rhythm.     Heart sounds: Normal heart sounds. No murmur heard.   No gallop.  Pulmonary:     Effort: Pulmonary effort is normal. No accessory muscle usage or respiratory distress.     Breath sounds: Normal breath sounds.  Abdominal:     General: Bowel sounds are normal.     Palpations: Abdomen is soft.  Musculoskeletal:     Cervical back: Normal range of motion and neck supple.     Right lower leg: No edema.     Left lower leg: No edema.     Right foot: Normal range of motion.     Left foot: Normal range of motion.  Lymphadenopathy:     Cervical: No cervical adenopathy.  Skin:    General: Skin is warm and dry.  Neurological:     Mental Status: She is alert and oriented to person, place, and time.     Sensory: Sensory deficit present.     Motor: Motor function is intact.     Coordination: Coordination is intact.     Gait: Gait is intact.     Deep Tendon Reflexes: Reflexes are normal and symmetric.     Reflex Scores:      Brachioradialis reflexes are 2+ on the right side and 2+ on the left side.      Patellar reflexes are 2+ on the right side and 2+ on the left side.    Comments: Sensation decreased bilateral feet recent exam -- 2/10 right and 4/10 left.  Psychiatric:        Attention and Perception: Attention normal.        Mood and Affect: Mood normal.        Speech: Speech  normal.        Behavior: Behavior normal. Behavior is  cooperative.        Thought Content: Thought content normal.    Results for orders placed or performed in visit on 08/19/20  Comprehensive metabolic panel  Result Value Ref Range   Glucose 96 65 - 99 mg/dL   BUN 14 6 - 24 mg/dL   Creatinine, Ser 0.87 0.57 - 1.00 mg/dL   eGFR 77 >59 mL/min/1.73   BUN/Creatinine Ratio 16 9 - 23   Sodium 138 134 - 144 mmol/L   Potassium 3.9 3.5 - 5.2 mmol/L   Chloride 98 96 - 106 mmol/L   CO2 25 20 - 29 mmol/L   Calcium 10.8 (H) 8.7 - 10.2 mg/dL   Total Protein 6.8 6.0 - 8.5 g/dL   Albumin 4.8 3.8 - 4.9 g/dL   Globulin, Total 2.0 1.5 - 4.5 g/dL   Albumin/Globulin Ratio 2.4 (H) 1.2 - 2.2   Bilirubin Total 0.3 0.0 - 1.2 mg/dL   Alkaline Phosphatase 70 44 - 121 IU/L   AST 13 0 - 40 IU/L   ALT 11 0 - 32 IU/L  CBC with Differential/Platelet  Result Value Ref Range   WBC 10.1 3.4 - 10.8 x10E3/uL   RBC 4.35 3.77 - 5.28 x10E6/uL   Hemoglobin 13.8 11.1 - 15.9 g/dL   Hematocrit 40.5 34.0 - 46.6 %   MCV 93 79 - 97 fL   MCH 31.7 26.6 - 33.0 pg   MCHC 34.1 31.5 - 35.7 g/dL   RDW 12.8 11.7 - 15.4 %   Platelets 328 150 - 450 x10E3/uL   Neutrophils 48 Not Estab. %   Lymphs 42 Not Estab. %   Monocytes 7 Not Estab. %   Eos 2 Not Estab. %   Basos 1 Not Estab. %   Neutrophils Absolute 4.9 1.4 - 7.0 x10E3/uL   Lymphocytes Absolute 4.2 (H) 0.7 - 3.1 x10E3/uL   Monocytes Absolute 0.7 0.1 - 0.9 x10E3/uL   EOS (ABSOLUTE) 0.2 0.0 - 0.4 x10E3/uL   Basophils Absolute 0.1 0.0 - 0.2 x10E3/uL   Immature Granulocytes 0 Not Estab. %   Immature Grans (Abs) 0.0 0.0 - 0.1 x10E3/uL  TSH  Result Value Ref Range   TSH 2.830 0.450 - 4.500 uIU/mL  Vitamin B12  Result Value Ref Range   Vitamin B-12 >2000 (H) 232 - 1245 pg/mL  HgB A1c  Result Value Ref Range   Hgb A1c MFr Bld 6.6 (H) 4.8 - 5.6 %   Est. average glucose Bld gHb Est-mCnc 143 mg/dL      Assessment & Plan:   Problem List Items Addressed This Visit       Cardiovascular and Mediastinum   Hypertension     Chronic, stable with BP at goal.  Reports she does get white coat syndrome in office. Recommend she monitor BP at least a few mornings a week at home and document.  DASH diet at home.  Continue current medication regimen and adjust as needed.  Labs today: CMP and urine ALB = 30.  Return in 6 months.       Relevant Medications   losartan-hydrochlorothiazide (HYZAAR) 100-25 MG tablet   Other Relevant Orders   Comprehensive metabolic panel     Nervous and Auditory   Polyneuropathy    Chronic, ongoing.   Was diagnosed by neurology, with severe chronic sensory polyneuropathy.  Would like second opinion on treatment as is fearful of being w/c bound like mother who  had similar, ?genetic element.  Will place referral to [facility] neuromuscular clinic for second opinion in new year if covered by new insurance, old insurance did not cover.  Repeat labs today CMP.  She declines medication.  Return in 6 months, sooner if worsening.      Relevant Medications   FLUoxetine (PROZAC) 20 MG capsule     Other   Elevated hemoglobin A1c    6.6% last visit and 6.5% today, she is focused on diet and exercise.  Declines medication at this time, but if trend upwards have recommended she start this.  Recheck in 6 months.      Relevant Orders   Bayer DCA Hb A1c Waived   Microalbumin, Urine Waived   Hyperlipidemia    Chronic, stable with ASCVD 5.6%.  Continue focus on diet regimen at home.   Lipid panel today.      Relevant Medications   losartan-hydrochlorothiazide (HYZAAR) 100-25 MG tablet   Other Relevant Orders   Comprehensive metabolic panel   Lipid Panel w/o Chol/HDL Ratio   Major depressive disorder, single episode, in remission ([place]) - Primary    Chronic, stable.  Denies SI/HI.  Continue current regimen and adjust as needed.  Refills sent.      Relevant Medications   FLUoxetine (PROZAC) 20 MG capsule   Other Visit Diagnoses     Vitamin D deficiency       History of low levels, check today and  adjust supplement as needed.   Relevant Orders   VITAMIN D 25 Hydroxy (Vit-D Deficiency, Fractures)        Follow up plan: Return in about 6 months (around 08/22/2021) for NEUROPATHY, DEPRESSION, HTN/HLD.

## 2021-02-21 NOTE — Assessment & Plan Note (Signed)
6.6% last visit and 6.5% today, she is focused on diet and exercise.  Declines medication at this time, but if trend upwards have recommended she start this.  Recheck in 6 months.

## 2021-02-21 NOTE — Assessment & Plan Note (Signed)
Chronic, stable.  Denies SI/HI.  Continue current regimen and adjust as needed.  Refills sent.

## 2021-02-21 NOTE — Assessment & Plan Note (Signed)
Chronic, stable with ASCVD 5.6%.  Continue focus on diet regimen at home.   Lipid panel today.

## 2021-02-22 LAB — COMPREHENSIVE METABOLIC PANEL
ALT: 11 IU/L (ref 0–32)
AST: 13 IU/L (ref 0–40)
Albumin/Globulin Ratio: 2.3 — ABNORMAL HIGH (ref 1.2–2.2)
Albumin: 4.8 g/dL (ref 3.8–4.9)
Alkaline Phosphatase: 69 IU/L (ref 44–121)
BUN/Creatinine Ratio: 16 (ref 9–23)
BUN: 14 mg/dL (ref 6–24)
Bilirubin Total: 0.4 mg/dL (ref 0.0–1.2)
CO2: 24 mmol/L (ref 20–29)
Calcium: 10 mg/dL (ref 8.7–10.2)
Chloride: 100 mmol/L (ref 96–106)
Creatinine, Ser: 0.9 mg/dL (ref 0.57–1.00)
Globulin, Total: 2.1 g/dL (ref 1.5–4.5)
Glucose: 126 mg/dL — ABNORMAL HIGH (ref 70–99)
Potassium: 4 mmol/L (ref 3.5–5.2)
Sodium: 137 mmol/L (ref 134–144)
Total Protein: 6.9 g/dL (ref 6.0–8.5)
eGFR: 74 mL/min/{1.73_m2} (ref >59)

## 2021-02-22 LAB — LIPID PANEL W/O CHOL/HDL RATIO
Cholesterol, Total: 281 mg/dL — ABNORMAL HIGH (ref 100–199)
HDL: 33 mg/dL — ABNORMAL LOW (ref >39)
LDL Chol Calc (NIH): 169 mg/dL — ABNORMAL HIGH (ref 0–99)
Triglycerides: 407 mg/dL — ABNORMAL HIGH (ref 0–149)
VLDL Cholesterol Cal: 79 mg/dL — ABNORMAL HIGH (ref 5–40)

## 2021-02-22 LAB — VITAMIN D 25 HYDROXY (VIT D DEFICIENCY, FRACTURES): Vit D, 25-Hydroxy: 50.8 ng/mL (ref 30.0–100.0)

## 2021-02-22 NOTE — Progress Notes (Signed)
Contacted via MyChart °The 10-year ASCVD risk score (Arnett DK, et al., 2019) is: 6.3% °  Values used to calculate the score: °    Age: 60 years °    Sex: Female °    Is Non-Hispanic African American: No °    Diabetic: No °    Tobacco smoker: No °    Systolic Blood Pressure: 120 mmHg °    Is BP treated: Yes °    HDL Cholesterol: 33 mg/dL °    Total Cholesterol: 281 mg/dL ° ° °Good evening Giara, your labs have returned: °- Kidney function, creatinine and eGFR, is normal, as is liver function, AST and ALT. °- Glucose, sugar, a little elevated, which goes along with A1c as we discussed. °- Vitamin D is normal °- Your cholesterol is still high, but continued recommendations to make lifestyle changes. Your LDL is above normal.  The LDL is the bad cholesterol.  Over time and in combination with inflammation and other factors, this contributes to plaque which in turn may lead to stroke and/or heart attack down the road.  Sometimes high LDL is primarily genetic, and people might be eating all the right foods but still have high numbers.  Other times, there is room for improvement in one's diet and eating healthier can bring this number down and potentially reduce one's risk of heart attack and/or stroke.  °  °To reduce your LDL, Remember - more fruits and vegetables, more fish, and limit red meat and dairy products.  More soy, nuts, beans, barley, lentils, oats and plant sterol ester enriched margarine instead of butter.  I also encourage eliminating sugar and processed food.  Remember, shop on the outside of the grocery store and visit your Farmer's Market.   If you would like to talk with me about dietary changes plus or minus medications for your cholesterol, please let me know. We should recheck your cholesterol in 3-6 months.  Any questions?  I recommend fasting lipid check next visit and add some fish oil daily to regimen. °Keep being amazing!!  Thank you for allowing me to participate in your care.  I appreciate  you. °Kindest regards, °Jolene ° °  °

## 2021-03-03 ENCOUNTER — Other Ambulatory Visit: Payer: Self-pay | Admitting: Nurse Practitioner

## 2021-03-03 NOTE — Telephone Encounter (Signed)
[facility] called and spoke to [place], [employer] about the refill(s) prozac requested. Advised it was sent on 02/21/21 #90/4 refill(s).He states rx wasn't received and need to resend. Advised him I will resend so that way pt can get medication.  Requested Prescriptions  Pending Prescriptions Disp Refills   FLUoxetine (PROZAC) 20 MG capsule [Pharmacy Med Name: FLUOXETINE 20MG  CAPSULES] 90 capsule 4    Sig: TAKE 1 CAPSULE BY MOUTH EVERY DAY     Psychiatry:  Antidepressants - SSRI Passed - 03/03/2021  3:38 AM      Passed - Completed PHQ-2 or PHQ-9 in the last 360 days      Passed - Valid encounter within last 6 months    Recent Outpatient Visits           1 week ago Major depressive disorder, single episode, in remission (HCC)   [facility] Cannady, 05/01/2021, NP   6 months ago Polyneuropathy   [facility] [place], [place], NP   1 year ago Leg numbness   [facility] [place], [place] T, NP   1 year ago Leg numbness   [facility] [place], [place] T, NP   1 year ago Essential hypertension   [facility] [place], [place], NP       Future Appointments             In 5 months Cannady, Dorie Rank, NP Dorie Rank, [facility]

## 2021-03-08 ENCOUNTER — Encounter: Payer: Self-pay | Admitting: Nurse Practitioner

## 2021-03-09 ENCOUNTER — Other Ambulatory Visit: Payer: Self-pay

## 2021-03-09 ENCOUNTER — Other Ambulatory Visit: Payer: Self-pay | Admitting: Nurse Practitioner

## 2021-03-09 MED ORDER — VALACYCLOVIR HCL 500 MG PO TABS: 500.0000 mg | ORAL_TABLET | Freq: Every day | ORAL | 4 refills | Status: DC

## 2021-03-09 MED ORDER — VALACYCLOVIR HCL 500 MG PO TABS
500.0000 mg | ORAL_TABLET | Freq: Every day | ORAL | 4 refills | Status: DC
Start: 2021-03-09 — End: 2021-03-10

## 2021-03-09 MED ORDER — FLUOXETINE HCL 20 MG PO CAPS: ORAL_CAPSULE | ORAL | 4 refills | Status: DC

## 2021-03-09 MED ORDER — LOSARTAN POTASSIUM-HCTZ 100-25 MG PO TABS: 1.0000 | ORAL_TABLET | Freq: Every day | ORAL | 4 refills | Status: DC

## 2021-03-10 ENCOUNTER — Other Ambulatory Visit: Payer: Self-pay | Admitting: Nurse Practitioner

## 2021-03-10 MED ORDER — LOSARTAN POTASSIUM-HCTZ 100-25 MG PO TABS: 1.0000 | ORAL_TABLET | Freq: Every day | ORAL | 4 refills | Status: DC

## 2021-03-10 MED ORDER — FLUOXETINE HCL 20 MG PO CAPS: ORAL_CAPSULE | ORAL | 4 refills | Status: DC

## 2021-03-10 MED ORDER — VALACYCLOVIR HCL 500 MG PO TABS: 500.0000 mg | ORAL_TABLET | Freq: Every day | ORAL | 4 refills | Status: DC

## 2021-08-22 ENCOUNTER — Ambulatory Visit (INDEPENDENT_AMBULATORY_CARE_PROVIDER_SITE_OTHER): Payer: 59 | Admitting: Nurse Practitioner

## 2021-08-22 ENCOUNTER — Encounter: Payer: Self-pay | Admitting: Nurse Practitioner

## 2021-08-22 VITALS — BP 106/72 | HR 75 | Temp 98.5°F | Wt 165.2 lb

## 2021-08-22 DIAGNOSIS — E782 Mixed hyperlipidemia: Secondary | ICD-10-CM

## 2021-08-22 DIAGNOSIS — E6609 Other obesity due to excess calories: Secondary | ICD-10-CM

## 2021-08-22 DIAGNOSIS — F325 Major depressive disorder, single episode, in full remission: Secondary | ICD-10-CM | POA: Diagnosis not present

## 2021-08-22 DIAGNOSIS — I10 Essential (primary) hypertension: Secondary | ICD-10-CM | POA: Diagnosis not present

## 2021-08-22 DIAGNOSIS — R7309 Other abnormal glucose: Secondary | ICD-10-CM | POA: Diagnosis not present

## 2021-08-22 DIAGNOSIS — R69 Illness, unspecified: Secondary | ICD-10-CM | POA: Diagnosis not present

## 2021-08-22 DIAGNOSIS — G629 Polyneuropathy, unspecified: Secondary | ICD-10-CM | POA: Diagnosis not present

## 2021-08-22 DIAGNOSIS — Z6827 Body mass index (BMI) 27.0-27.9, adult: Secondary | ICD-10-CM | POA: Diagnosis not present

## 2021-08-22 LAB — MICROALBUMIN, URINE WAIVED
Creatinine, Urine Waived: 200 mg/dL (ref 10–300)
Microalb, Ur Waived: 10 mg/L (ref 0–19)
Microalb/Creat Ratio: <30 mg/g (ref <30)

## 2021-08-22 LAB — BAYER DCA HB A1C WAIVED: HB A1C (BAYER DCA - WAIVED): 5.8 % — ABNORMAL HIGH (ref 4.8–5.6)

## 2021-08-22 MED ORDER — LOSARTAN POTASSIUM 100 MG PO TABS: 100.0000 mg | ORAL_TABLET | Freq: Every day | ORAL | 4 refills | Status: DC

## 2021-08-22 NOTE — Assessment & Plan Note (Signed)
A1c 5.8% today, trend down for patient!!  Praised for this and for 16 pounds weight loss.  Recommend she continue heavy focus on diet changes and regular activity.

## 2021-08-22 NOTE — Assessment & Plan Note (Signed)
BMI 27.07 today, has lost 16 pounds.  Praised for this!!  Recommended eating smaller high protein, low fat meals more frequently and exercising 30 mins a day 5 times a week with a goal of 10-15lb weight loss in the next 3 months. Patient voiced their understanding and motivation to adhere to these recommendations.

## 2021-08-22 NOTE — Progress Notes (Signed)
BP 106/72   Pulse 75   Temp 98.5 F (36.9 C) (Oral)   Wt 165 lb 3.2 oz (74.9 kg)   LMP 01/16/2010 (Approximate)   SpO2 98%   BMI 27.07 kg/m    Subjective:    Patient ID: Jo Martinez, female    DOB: May 07, 1960, 61 y.o.   MRN: [id number]  HPI: [name] is a 61 y.o. female  Chief Complaint  Patient presents with   Hypertension   Hyperlipidemia   Depression   Peripheral Neuropathy   HYPERTENSION / HYPERLIPIDEMIA Continues on Losartan-HCTZ.  No statin, refuses this. Satisfied with current treatment? yes Duration of hypertension: chronic BP monitoring frequency:  not often BP range:  BP medication side effects: no Duration of hyperlipidemia: chronic Cholesterol supplements: none Aspirin: no Recent stressors: no Recurrent headaches: no Visual changes: no Palpitations: no Dyspnea: no Chest pain: no Lower extremity edema: no Dizzy/lightheaded: no  The 10-year ASCVD risk score (Arnett DK, et al., 2019) is: 5.3%   Values used to calculate the score:     Age: 63 years     Sex: Female     Is Non-Hispanic African American: No     Diabetic: No     Tobacco smoker: No     Systolic Blood Pressure: 106 mmHg     Is BP treated: Yes     HDL Cholesterol: 33 mg/dL     Total Cholesterol: 281 mg/dL   NEUROPATHY She was seen by neurology 03/23/20 and had EMG with them in February -- 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  She reports neurology told her there was nothing they could do to help her.  Her mother had similar issues.  Does not drive often as can not feel pedals -- her husband drives her on occasion.  Is concerned about being like her mother who was wheel chair bound and bed ridden. She did have lumbar spondylosis noted on MRI, saw neurosurgery for this with no recommendation for surgery.   Last A1c 6.5% in December 2022 -- she has been focusing on diet changes and lost 16 pounds total since last visit.  She prefers not to take medication  unless needed.   Neuropathy status: stable  Satisfied with current treatment?: yes Location:  Pain: yes Severity: mild  Quality:  tingling and pins and needles Frequency: constant Bilateral: yes Symmetric: yes Numbness: yes Decreased sensation: yes Weakness: no Context: stable Alleviating factors: no medications, has tried without benefit Aggravating factors: nothing Treatments attempted: various medications  DEPRESSION Continues on Prozac daily. Mood status: stable Satisfied with current treatment?: yes Symptom severity: mild  Duration of current treatment : chronic Side effects: no Medication compliance: good compliance Psychotherapy/counseling: none Depressed mood: no Anxious mood: no Anhedonia: no Significant weight loss or gain: no Insomnia: occasional Fatigue: no Feelings of worthlessness or guilt: no Impaired concentration/indecisiveness: no Suicidal ideations: no Hopelessness: no Crying spells: no    08/22/2021    2:09 PM 02/21/2021    2:13 PM 08/19/2020    2:46 PM 02/02/2020    3:39 PM 12/22/2019    3:15 PM  Depression screen PHQ 2/9  Decreased Interest 0 0 0 0 0  Down, Depressed, Hopeless 0 0 0 0 0  PHQ - 2 Score 0 0 0 0 0  Altered sleeping 1 0 1  1  Tired, decreased energy 0 0 0  0  Change in appetite 0 0 0  0  Feeling bad or failure about yourself  0 0 0  0  Trouble concentrating 0 0 0  0  Moving slowly or fidgety/restless 0 0 0  0  Suicidal thoughts 0 0 0  0  PHQ-9 Score 1 0 1  1  Difficult doing work/chores Not difficult at all  Not difficult at all  Not difficult at all       08/22/2021    2:10 PM 02/21/2021    2:13 PM 03/06/2019    9:55 AM  GAD 7 : Generalized Anxiety Score  Nervous, Anxious, on Edge 0 0 0  Control/stop worrying 0 0 0  Worry too much - different things 0 0 0  Trouble relaxing 0 0 0  Restless 0 0 0  Easily annoyed or irritable 0 0 0  Afraid - awful might happen 0 0 0  Total GAD 7 Score 0 0 0  Anxiety Difficulty Not  difficult at all Not difficult at all Not difficult at all   Relevant past medical, surgical, family and social history reviewed and updated as indicated. Interim medical history since our last visit reviewed. Allergies and medications reviewed and updated.  Review of Systems  Constitutional:  Negative for activity change, appetite change, diaphoresis, fatigue and fever.  Respiratory:  Negative for cough, chest tightness, shortness of breath and wheezing.   Cardiovascular:  Negative for chest pain, palpitations and leg swelling.  Gastrointestinal: Negative.   Endocrine: Negative for cold intolerance and heat intolerance.  Neurological:  Positive for numbness. Negative for dizziness, syncope, weakness, light-headedness and headaches.  Psychiatric/Behavioral: Negative.      Per HPI unless specifically indicated above     Objective:    BP 106/72   Pulse 75   Temp 98.5 F (36.9 C) (Oral)   Wt 165 lb 3.2 oz (74.9 kg)   LMP 01/16/2010 (Approximate)   SpO2 98%   BMI 27.07 kg/m   Wt Readings from Last 3 Encounters:  08/22/21 165 lb 3.2 oz (74.9 kg)  02/21/21 181 lb 6.4 oz (82.3 kg)  08/19/20 183 lb 12.8 oz (83.4 kg)    Physical Exam Vitals and nursing note reviewed.  Constitutional:      General: She is awake. She is not in acute distress.    Appearance: She is well-developed and well-groomed. She is not ill-appearing.  HENT:     Head: Normocephalic.     Right Ear: Hearing normal.     Left Ear: Hearing normal.  Eyes:     General: Lids are normal.        Right eye: No discharge.        Left eye: No discharge.     Conjunctiva/sclera: Conjunctivae normal.     Pupils: Pupils are equal, round, and reactive to light.  Neck:     Thyroid: No thyromegaly.     Vascular: No carotid bruit.  Cardiovascular:     Rate and Rhythm: Normal rate and regular rhythm.     Heart sounds: Normal heart sounds. No murmur heard.    No gallop.  Pulmonary:     Effort: Pulmonary effort is normal.  No accessory muscle usage or respiratory distress.     Breath sounds: Normal breath sounds.  Abdominal:     General: Bowel sounds are normal.     Palpations: Abdomen is soft.  Musculoskeletal:     Cervical back: Normal range of motion and neck supple.     Right lower leg: No edema.     Left lower leg: No edema.  Right foot: Normal range of motion.     Left foot: Normal range of motion.  Lymphadenopathy:     Cervical: No cervical adenopathy.  Skin:    General: Skin is warm and dry.  Neurological:     Mental Status: She is alert and oriented to person, place, and time.     Sensory: Sensory deficit present.     Motor: Motor function is intact.     Coordination: Coordination is intact.     Gait: Gait is intact.     Deep Tendon Reflexes: Reflexes are normal and symmetric.     Reflex Scores:      Brachioradialis reflexes are 2+ on the right side and 2+ on the left side.      Patellar reflexes are 2+ on the right side and 2+ on the left side. Psychiatric:        Attention and Perception: Attention normal.        Mood and Affect: Mood normal.        Speech: Speech normal.        Behavior: Behavior normal. Behavior is cooperative.        Thought Content: Thought content normal.    Results for orders placed or performed in visit on 08/22/21  Bayer DCA Hb A1c Waived  Result Value Ref Range   HB A1C (BAYER DCA - WAIVED) 5.8 (H) 4.8 - 5.6 %  Microalbumin, Urine Waived  Result Value Ref Range   Microalb, Ur Waived 10 0 - 19 mg/L   Creatinine, Urine Waived 200 10 - 300 mg/dL   Microalb/Creat Ratio <30 <30 mg/g      Assessment & Plan:   Problem List Items Addressed This Visit       Cardiovascular and Mediastinum   Hypertension    Chronic, stable with BP well below goal and has lost 16 pounds with diet changes.   Praised for weight loss.  Will stop Hyzaar and change to Losartan 100 MG only, discussed with patient, with plan to further reduce if ongoing weight maintenance.   Recommend she monitor BP at least a few mornings a week at home and document.  DASH diet at home.  Labs today: CMP and TSH + urine ALB (was 30 in December 2022).  Return in 6 months.       Relevant Medications   losartan (COZAAR) 100 MG tablet   Other Relevant Orders   Comprehensive metabolic panel   TSH     Nervous and Auditory   Polyneuropathy    Chronic, ongoing.   Was diagnosed by neurology in 2022, with severe chronic sensory polyneuropathy.  Is fearful of being w/c bound like mother who had similar, ?genetic element.  Had placed referral to [facility] neuromuscular clinic for second opinion, but she did not attend, she will alert provider if she wants new referral.  May also benefit from PT referral, but will alert provider if wishes to try this.  Repeat labs today CMP and TSH.  She declines medication.  Return in 6 months, sooner if worsening.      Relevant Orders   Bayer DCA Hb A1c Waived (Completed)   Comprehensive metabolic panel     Other   BMI 27.0-27.9,adult    BMI 27.07 today, has lost 16 pounds.  Praised for this!!  Recommended eating smaller high protein, low fat meals more frequently and exercising 30 mins a day 5 times a week with a goal of 10-15lb weight loss in the next 3 months.  Patient voiced their understanding and motivation to adhere to these recommendations.       Elevated hemoglobin A1c    A1c 5.8% today, trend down for patient!!  Praised for this and for 16 pounds weight loss.  Recommend she continue heavy focus on diet changes and regular activity.        Relevant Orders   Bayer DCA Hb A1c Waived (Completed)   Microalbumin, Urine Waived (Completed)   Hyperlipidemia    Chronic, stable with ASCVD 5.3%.  Continue focus on diet regimen at home. Lipid panel today.      Relevant Medications   losartan (COZAAR) 100 MG tablet   Other Relevant Orders   Comprehensive metabolic panel   Lipid Panel w/o Chol/HDL Ratio   Major depressive disorder, single episode, in  remission (HCC) - Primary    Chronic, stable.  Denies SI/HI.  Continue current regimen and adjust as needed.  Refills sent as needed.        Follow up plan: Return in about 6 months (around 02/21/2022) for PREDIABETES, NEUROPATHY, HTN/HLD, MOOD.

## 2021-08-23 ENCOUNTER — Encounter: Payer: Self-pay | Admitting: Nurse Practitioner

## 2021-08-23 ENCOUNTER — Other Ambulatory Visit: Payer: Self-pay | Admitting: Nurse Practitioner

## 2021-08-23 DIAGNOSIS — E782 Mixed hyperlipidemia: Secondary | ICD-10-CM

## 2021-08-23 LAB — COMPREHENSIVE METABOLIC PANEL
ALT: 13 IU/L (ref 0–32)
AST: 16 IU/L (ref 0–40)
Albumin/Globulin Ratio: 2.4 — ABNORMAL HIGH (ref 1.2–2.2)
Albumin: 5 g/dL — ABNORMAL HIGH (ref 3.8–4.9)
Alkaline Phosphatase: 78 IU/L (ref 44–121)
BUN/Creatinine Ratio: 15 (ref 9–23)
BUN: 13 mg/dL (ref 6–24)
Bilirubin Total: 0.4 mg/dL (ref 0.0–1.2)
CO2: 20 mmol/L (ref 20–29)
Calcium: 10.1 mg/dL (ref 8.7–10.2)
Chloride: 98 mmol/L (ref 96–106)
Creatinine, Ser: 0.85 mg/dL (ref 0.57–1.00)
Globulin, Total: 2.1 g/dL (ref 1.5–4.5)
Glucose: 115 mg/dL — ABNORMAL HIGH (ref 70–99)
Potassium: 4.3 mmol/L (ref 3.5–5.2)
Sodium: 136 mmol/L (ref 134–144)
Total Protein: 7.1 g/dL (ref 6.0–8.5)
eGFR: 79 mL/min/{1.73_m2} (ref >59)

## 2021-08-23 LAB — LIPID PANEL W/O CHOL/HDL RATIO
Cholesterol, Total: 293 mg/dL — ABNORMAL HIGH (ref 100–199)
HDL: 35 mg/dL — ABNORMAL LOW (ref >39)
LDL Chol Calc (NIH): 205 mg/dL — ABNORMAL HIGH (ref 0–99)
Triglycerides: 267 mg/dL — ABNORMAL HIGH (ref 0–149)
VLDL Cholesterol Cal: 53 mg/dL — ABNORMAL HIGH (ref 5–40)

## 2021-08-23 LAB — TSH: TSH: 3.42 u[IU]/mL (ref 0.450–4.500)

## 2021-08-23 NOTE — Progress Notes (Signed)
Contacted via MyChart -- need lab only visit in 2 weeks.   Good evening Morgana, your labs have returned and overall remain stable with exception of cholesterol levels which are quite elevated with LDL above 190.  At this time I would recommend starting cholesterol medication as you are at higher risk of stroke.  Were you fasting?  If not I want to repeat these I next couple weeks via outpatient labs with you fasting to recheck.  Any questions? Keep being wonderful!!  Thank you for allowing me to participate in your care.  I appreciate you. Kindest regards, Shelby Anderle

## 2021-10-26 ENCOUNTER — Encounter: Payer: Self-pay | Admitting: Nurse Practitioner

## 2021-10-26 DIAGNOSIS — G629 Polyneuropathy, unspecified: Secondary | ICD-10-CM

## 2022-01-09 DIAGNOSIS — G629 Polyneuropathy, unspecified: Secondary | ICD-10-CM | POA: Diagnosis not present

## 2022-01-15 ENCOUNTER — Other Ambulatory Visit: Payer: Self-pay | Admitting: Nurse Practitioner

## 2022-01-16 NOTE — Telephone Encounter (Signed)
Unable to refill per protocol, last refill 03/10/21 for 90 and 4 RF, request is too soon.E-Prescribing Status: Receipt confirmed by pharmacy (03/10/2021  8:48 AM EST). Will refuse.  Requested Prescriptions  Pending Prescriptions Disp Refills   FLUoxetine (PROZAC) 20 MG capsule [Pharmacy Med Name: FLUOXETIN(P) CAP 20MG ] 90 capsule 3    Sig: TAKE 1 CAPSULE DAILY     Psychiatry:  Antidepressants - SSRI Passed - 01/15/2022  2:16 PM      Passed - Completed PHQ-2 or PHQ-9 in the last 360 days      Passed - Valid encounter within last 6 months    Recent Outpatient Visits           4 months ago Major depressive disorder, single episode, in remission (HCC)   [facility] Cannady, Jolene T, NP   10 months ago Major depressive disorder, single episode, in remission (HCC)   [facility] Cannady, 01/17/2022, NP   1 year ago Polyneuropathy   [facility] [place], [place] T, NP   1 year ago Leg numbness   [facility] [place], [place] T, NP   2 years ago Leg numbness   [facility] [place], [place], NP       Future Appointments             In 1 month Cannady, Dorie Rank, NP Dorie Rank, [facility]             valACYclovir (VALTREX) 500 MG tablet [Pharmacy Med Name: VALACYCLOVIR TAB 500MG ] 90 tablet 3    Sig: TAKE 1 TABLET DAILY     Antimicrobials:  Antiviral Agents - Anti-Herpetic Passed - 01/15/2022  2:16 PM      Passed - Valid encounter within last 12 months    Recent Outpatient Visits           4 months ago Major depressive disorder, single episode, in remission (HCC)   [facility] Cannady, Jolene T, NP   10 months ago Major depressive disorder, single episode, in remission (HCC)   [facility] Cannady, , NP   1 year ago Polyneuropathy   [facility] [place], [place] T, NP   1 year ago Leg numbness   [facility] [place], Mar[place] T, NP   2 years ago Leg numbness    [facility] [place], GRUMS, NP       Future Appointments             In 1 month Cannady, [place], NP Dorie Rank, [facility]

## 2022-02-09 DIAGNOSIS — R519 Headache, unspecified: Secondary | ICD-10-CM | POA: Diagnosis not present

## 2022-02-19 NOTE — Patient Instructions (Signed)
Managing Depression, Adult Depression is a mental health condition that affects your thoughts, feelings, and actions. Being diagnosed with depression can bring you relief if you did not know why you have felt or behaved a certain way. It could also leave you feeling overwhelmed. Finding ways to manage your symptoms can help you feel more positive about your future. How to manage lifestyle changes Being depressed is difficult. Depression can increase the level of everyday stress. Stress can make depression symptoms worse. You may believe your symptoms cannot be managed or will never improve. However, there are many things you can try to help manage your symptoms. There is hope. Managing stress  Stress is your body's reaction to life changes and events, both good and bad. Stress can add to your feelings of depression. Learning to manage your stress can help lessen your feelings of depression. Try some of the following approaches to reducing your stress (stress reduction techniques): Listen to music that you enjoy and that inspires you. Try using a meditation app or take a meditation class. Develop a practice that helps you connect with your spiritual self. Walk in nature, pray, or go to a place of worship. Practice deep breathing. To do this, inhale slowly through your nose. Pause at the top of your inhale for a few seconds and then exhale slowly, letting yourself relax. Repeat this three or four times. Practice yoga to help relax and work your muscles. Choose a stress reduction technique that works for you. These techniques take time and practice to develop. Set aside 5-15 minutes a day to do them. Therapists can offer training in these techniques. Do these things to help manage stress: Keep a journal. Know your limits. Set healthy boundaries for yourself and others, such as saying "no" when you think something is too much. Pay attention to how you react to certain situations. You may not be able to  control everything, but you can change your reaction. Add humor to your life by watching funny movies or shows. Make time for activities that you enjoy and that relax you. Spend less time using electronics, especially at night before bed. The light from screens can make your brain think it is time to get up rather than go to bed.  Medicines Medicines, such as antidepressants, are often a part of treatment for depression. Talk with your pharmacist or health care provider about all the medicines, supplements, and herbal products that you take, their possible side effects, and what medicines and other products are safe to take together. Make sure to report any side effects you may have to your health care provider. Relationships Your health care provider may suggest family therapy, couples therapy, or individual therapy as part of your treatment. How to recognize changes Everyone responds differently to treatment for depression. As you recover from depression, you may start to: Have more interest in doing activities. Feel more hopeful. Have more energy. Eat a more regular amount of food. Have better mental focus. It is important to recognize if your depression is not getting better or is getting worse. The symptoms you had in the beginning may return, such as: Feeling tired. Eating too much or too little. Sleeping too much or too little. Feeling restless, agitated, or hopeless. Trouble focusing or making decisions. Having unexplained aches and pains. Feeling irritable, angry, or aggressive. If you or your family members notice these symptoms coming back, let your health care provider know right away. Follow these instructions at home: Activity Try to   get some form of exercise each day, such as walking. Try yoga, mindfulness, or other stress reduction techniques. Participate in group activities if you are able. Lifestyle Get enough sleep. Cut down on or stop using caffeine, tobacco,  alcohol, and any other harmful substances. Eat a healthy diet that includes plenty of vegetables, fruits, whole grains, low-fat dairy products, and lean protein. Limit foods that are high in solid fats, added sugar, or salt (sodium). General instructions Take over-the-counter and prescription medicines only as told by your health care provider. Keep all follow-up visits. It is important for your health care provider to check on your mood, behavior, and medicines. Your health care provider may need to make changes to your treatment. Where to find support Talking to others  Friends and family members can be sources of support and guidance. Talk to trusted friends or family members about your condition. Explain your symptoms and let them know that you are working with a health care provider to treat your depression. Tell friends and family how they can help. Finances Find mental health providers that fit with your financial situation. Talk with your health care provider if you are worried about access to food, housing, or medicine. Call your insurance company to learn about your co-pays and prescription plan. Where to find more information You can find support in your area from: Anxiety and Depression Association of America (ADAA): adaa.org Mental Health America: mentalhealthamerica.net National Alliance on Mental Illness: nami.org Contact a health care provider if: You stop taking your antidepressant medicines, and you have any of these symptoms: Nausea. Headache. Light-headedness. Chills and body aches. Not being able to sleep (insomnia). You or your friends and family think your depression is getting worse. Get help right away if: You have thoughts of hurting yourself or others. Get help right away if you feel like you may hurt yourself or others, or have thoughts about taking your own life. Go to your nearest emergency room or: Call 911. Call the National Suicide Prevention Lifeline at  1-800-273-8255 or 988. This is open 24 hours a day. Text the Crisis Text Line at 741741. This information is not intended to replace advice given to you by your health care provider. Make sure you discuss any questions you have with your health care provider. Document Revised: 06/20/2021 Document Reviewed: 06/20/2021 Elsevier Patient Education  2023 Elsevier Inc.  

## 2022-02-21 ENCOUNTER — Ambulatory Visit (INDEPENDENT_AMBULATORY_CARE_PROVIDER_SITE_OTHER): Payer: 59 | Admitting: Nurse Practitioner

## 2022-02-21 ENCOUNTER — Encounter: Payer: Self-pay | Admitting: Nurse Practitioner

## 2022-02-21 VITALS — BP 130/78 | HR 67 | Temp 98.0°F | Ht 65.51 in | Wt 172.8 lb

## 2022-02-21 DIAGNOSIS — Z6827 Body mass index (BMI) 27.0-27.9, adult: Secondary | ICD-10-CM

## 2022-02-21 DIAGNOSIS — E782 Mixed hyperlipidemia: Secondary | ICD-10-CM | POA: Diagnosis not present

## 2022-02-21 DIAGNOSIS — I10 Essential (primary) hypertension: Secondary | ICD-10-CM | POA: Diagnosis not present

## 2022-02-21 DIAGNOSIS — R7309 Other abnormal glucose: Secondary | ICD-10-CM | POA: Diagnosis not present

## 2022-02-21 DIAGNOSIS — F325 Major depressive disorder, single episode, in full remission: Secondary | ICD-10-CM

## 2022-02-21 DIAGNOSIS — R69 Illness, unspecified: Secondary | ICD-10-CM | POA: Diagnosis not present

## 2022-02-21 DIAGNOSIS — G629 Polyneuropathy, unspecified: Secondary | ICD-10-CM | POA: Diagnosis not present

## 2022-02-21 DIAGNOSIS — Z0000 Encounter for general adult medical examination without abnormal findings: Secondary | ICD-10-CM

## 2022-02-21 MED ORDER — VALACYCLOVIR HCL 500 MG PO TABS: 500.0000 mg | ORAL_TABLET | Freq: Every day | ORAL | 4 refills | Status: DC

## 2022-02-21 MED ORDER — LOSARTAN POTASSIUM 100 MG PO TABS: 100.0000 mg | ORAL_TABLET | Freq: Every day | ORAL | 4 refills | Status: DC

## 2022-02-21 MED ORDER — FLUOXETINE HCL 10 MG PO TABS: 10.0000 mg | ORAL_TABLET | Freq: Every day | ORAL | 3 refills | Status: DC

## 2022-02-21 NOTE — Assessment & Plan Note (Signed)
Chronic, stable with BP well below goal and has maintained majority of weight lost.   Praised for weight loss.  Will continue Losartan 100 MG daily and remain off HCTZ.  Recommend she monitor BP at least a few mornings a week at home and document.  DASH diet at home.  Labs today: CMP, CBC, TSH.  Return in 6 months.

## 2022-02-21 NOTE — Assessment & Plan Note (Signed)
BMI 28.31, slight gain this check.  Recommended eating smaller high protein, low fat meals more frequently and exercising 30 mins a day 5 times a week with a goal of 10-15lb weight loss in the next 3 months. Patient voiced their understanding and motivation to adhere to these recommendations.

## 2022-02-21 NOTE — Progress Notes (Signed)
BP 130/78 (BP Location: Left Arm, Patient Position: Sitting, Cuff Size: Normal)   Pulse 67   Temp 98 F (36.7 C) (Oral)   Ht 5' 5.51" (1.664 m)   Wt 172 lb 12.8 oz (78.4 kg)   LMP 01/16/2010 (Approximate)   SpO2 96%   BMI 28.31 kg/m    Subjective:    Patient ID: Jo Martinez, female    DOB: [birth year], 61 y.o.   MRN: [id number]  HPI: Jo Martinez is a 61 y.o. female  Chief Complaint  Patient presents with   Depression   Hypertension   HYPERTENSION / HYPERLIPIDEMIA Continues on Losartan.  No statin, refuses this. Satisfied with current treatment? yes Duration of hypertension: chronic BP monitoring frequency: rarely BP range:  BP medication side effects: no Duration of hyperlipidemia: chronic Cholesterol supplements: none Aspirin: no Recent stressors: no Recurrent headaches: no Visual changes: no Palpitations: no Dyspnea: no Chest pain: no Lower extremity edema: no Dizzy/lightheaded: no  The 10-year ASCVD risk score (Arnett DK, et al., 2019) is: 7.8%   Values used to calculate the score:     Age: 84 years     Sex: Female     Is Non-Hispanic African American: No     Diabetic: No     Tobacco smoker: No     Systolic Blood Pressure: 056 mmHg     Is BP treated: Yes     HDL Cholesterol: 35 mg/dL     Total Cholesterol: 293 mg/dL   NEUROPATHY She was seen by neurology 01/09/22 -- did labs and MRI which were all normal. Her mother had similar issues.  Does not drive often as can not feel pedals -- rarely drives anymore, husband driving more.  Is concerned about being like her mother who was wheel chair bound and bed ridden -- mother had lymphoma with chemo and radiation. She did have lumbar spondylosis noted on MRI, saw neurosurgery for this with no recommendation for surgery.   Last A1c 5.8% in June 2023 -- she has been focusing on diet changes and continues to maintain some of her weight loss.  Gained a little over holidays. Neuropathy status: stable  Satisfied  with current treatment?: yes Location:  Pain: yes Severity: mild  Quality:  tingling and pins and needles Frequency: constant Bilateral: yes Symmetric: yes Numbness: yes Decreased sensation: yes Weakness: no Context: stable Alleviating factors: no medications, has tried without benefit Aggravating factors: nothing Treatments attempted: various medications  DEPRESSION Continues on Prozac daily.  Has been on for years after break-up with boyfriend.  She would like to try to come off.   Mood status: stable Satisfied with current treatment?: yes Symptom severity: mild  Duration of current treatment : chronic Side effects: no Medication compliance: good compliance Psychotherapy/counseling: none Depressed mood: no Anxious mood: no Anhedonia: no Significant weight loss or gain: no Insomnia: occasional Fatigue: no Feelings of worthlessness or guilt: no Impaired concentration/indecisiveness: no Suicidal ideations: no Hopelessness: no Crying spells: no    02/21/2022    2:13 PM 08/22/2021    2:09 PM 02/21/2021    2:13 PM 08/19/2020    2:46 PM 02/02/2020    3:39 PM  Depression screen PHQ 2/9  Decreased Interest 0 0 0 0 0  Down, Depressed, Hopeless 0 0 0 0 0  PHQ - 2 Score 0 0 0 0 0  Altered sleeping 0 1 0 1   Tired, decreased energy 0 0 0 0   Change in appetite 0 0 0  0   Feeling bad or failure about yourself  0 0 0 0   Trouble concentrating 0 0 0 0   Moving slowly or fidgety/restless 0 0 0 0   Suicidal thoughts 0 0 0 0   PHQ-9 Score 0 1 0 1   Difficult doing work/chores Not difficult at all Not difficult at all  Not difficult at all        02/21/2022    2:15 PM 08/22/2021    2:10 PM 02/21/2021    2:13 PM 03/06/2019    9:55 AM  GAD 7 : Generalized Anxiety Score  Nervous, Anxious, on Edge 0 0 0 0  Control/stop worrying 0 0 0 0  Worry too much - different things 0 0 0 0  Trouble relaxing 0 0 0 0  Restless 0 0 0 0  Easily annoyed or irritable 0 0 0 0  Afraid - awful  might happen 0 0 0 0  Total GAD 7 Score 0 0 0 0  Anxiety Difficulty Not difficult at all Not difficult at all Not difficult at all Not difficult at all   Relevant past medical, surgical, family and social history reviewed and updated as indicated. Interim medical history since our last visit reviewed. Allergies and medications reviewed and updated.  Review of Systems  Constitutional:  Negative for activity change, appetite change, diaphoresis, fatigue and fever.  Respiratory:  Negative for cough, chest tightness, shortness of breath and wheezing.   Cardiovascular:  Negative for chest pain, palpitations and leg swelling.  Gastrointestinal: Negative.   Endocrine: Negative for cold intolerance and heat intolerance.  Neurological:  Positive for numbness. Negative for dizziness, syncope, weakness, light-headedness and headaches.  Psychiatric/Behavioral: Negative.      Per HPI unless specifically indicated above     Objective:    BP 130/78 (BP Location: Left Arm, Patient Position: Sitting, Cuff Size: Normal)   Pulse 67   Temp 98 F (36.7 C) (Oral)   Ht 5' 5.51" (1.664 m)   Wt 172 lb 12.8 oz (78.4 kg)   LMP 01/16/2010 (Approximate)   SpO2 96%   BMI 28.31 kg/m   Wt Readings from Last 3 Encounters:  02/21/22 172 lb 12.8 oz (78.4 kg)  08/22/21 165 lb 3.2 oz (74.9 kg)  02/21/21 181 lb 6.4 oz (82.3 kg)    Physical Exam Vitals and nursing note reviewed.  Constitutional:      General: She is awake. She is not in acute distress.    Appearance: She is well-developed and well-groomed. She is not ill-appearing.  HENT:     Head: Normocephalic.     Right Ear: Hearing normal.     Left Ear: Hearing normal.  Eyes:     General: Lids are normal.        Right eye: No discharge.        Left eye: No discharge.     Conjunctiva/sclera: Conjunctivae normal.     Pupils: Pupils are equal, round, and reactive to light.  Neck:     Thyroid: No thyromegaly.     Vascular: No carotid bruit.   Cardiovascular:     Rate and Rhythm: Normal rate and regular rhythm.     Heart sounds: Normal heart sounds. No murmur heard.    No gallop.  Pulmonary:     Effort: Pulmonary effort is normal. No accessory muscle usage or respiratory distress.     Breath sounds: Normal breath sounds.  Abdominal:     General: Bowel sounds are  normal.     Palpations: Abdomen is soft.  Musculoskeletal:     Cervical back: Normal range of motion and neck supple.     Right lower leg: No edema.     Left lower leg: No edema.     Right foot: Normal range of motion.     Left foot: Normal range of motion.  Lymphadenopathy:     Cervical: No cervical adenopathy.  Skin:    General: Skin is warm and dry.  Neurological:     Mental Status: She is alert and oriented to person, place, and time.     Sensory: Sensory deficit present.     Motor: Motor function is intact.     Coordination: Coordination is intact.     Gait: Gait is intact.     Deep Tendon Reflexes: Reflexes are normal and symmetric.     Reflex Scores:      Brachioradialis reflexes are 2+ on the right side and 2+ on the left side.      Patellar reflexes are 2+ on the right side and 2+ on the left side. Psychiatric:        Attention and Perception: Attention normal.        Mood and Affect: Mood normal.        Speech: Speech normal.        Behavior: Behavior normal. Behavior is cooperative.        Thought Content: Thought content normal.    Results for orders placed or performed in visit on 08/22/21  Bayer DCA Hb A1c Waived  Result Value Ref Range   HB A1C (BAYER DCA - WAIVED) 5.8 (H) 4.8 - 5.6 %  Microalbumin, Urine Waived  Result Value Ref Range   Microalb, Ur Waived 10 0 - 19 mg/L   Creatinine, Urine Waived 200 10 - 300 mg/dL   Microalb/Creat Ratio <30 <30 mg/g  Comprehensive metabolic panel  Result Value Ref Range   Glucose 115 (H) 70 - 99 mg/dL   BUN 13 6 - 24 mg/dL   Creatinine, Ser 0.85 0.57 - 1.00 mg/dL   eGFR 79 >59 mL/min/1.73    BUN/Creatinine Ratio 15 9 - 23   Sodium 136 134 - 144 mmol/L   Potassium 4.3 3.5 - 5.2 mmol/L   Chloride 98 96 - 106 mmol/L   CO2 20 20 - 29 mmol/L   Calcium 10.1 8.7 - 10.2 mg/dL   Total Protein 7.1 6.0 - 8.5 g/dL   Albumin 5.0 (H) 3.8 - 4.9 g/dL   Globulin, Total 2.1 1.5 - 4.5 g/dL   Albumin/Globulin Ratio 2.4 (H) 1.2 - 2.2   Bilirubin Total 0.4 0.0 - 1.2 mg/dL   Alkaline Phosphatase 78 44 - 121 IU/L   AST 16 0 - 40 IU/L   ALT 13 0 - 32 IU/L  Lipid Panel w/o Chol/HDL Ratio  Result Value Ref Range   Cholesterol, Total 293 (H) 100 - 199 mg/dL   Triglycerides 267 (H) 0 - 149 mg/dL   HDL 35 (L) >39 mg/dL   VLDL Cholesterol Cal 53 (H) 5 - 40 mg/dL   LDL Chol Calc (NIH) 205 (H) 0 - 99 mg/dL   Lipid Comment: Comment   TSH  Result Value Ref Range   TSH 3.420 0.450 - 4.500 uIU/mL      Assessment & Plan:   Problem List Items Addressed This Visit       Cardiovascular and Mediastinum   Hypertension    Chronic, stable with BP  well below goal and has maintained majority of weight lost.   Praised for weight loss.  Will continue Losartan 100 MG daily and remain off HCTZ.  Recommend she monitor BP at least a few mornings a week at home and document.  DASH diet at home.  Labs today: CMP, CBC, TSH.  Return in 6 months.       Relevant Medications   losartan (COZAAR) 100 MG tablet   Other Relevant Orders   Comprehensive metabolic panel     Nervous and Auditory   Polyneuropathy    Chronic, ongoing.   Was diagnosed by neurology in 2022, with severe chronic sensory polyneuropathy.  Is fearful of being w/c bound like mother who had similar, ?genetic element -- although her mother had lymphoma with radiation and chemo.  Continue collaboration with [place] neurology team.  May also benefit from PT referral, but will alert provider if wishes to try this.  Repeat labs today CMP and TSH.  She declines medication.  Return in 6 months, sooner if worsening.      Relevant Medications   FLUoxetine  (PROZAC) 10 MG tablet   Other Relevant Orders   Comprehensive metabolic panel   TSH   HgB A1c     Other   BMI 27.0-27.9,adult    BMI 28.31, slight gain this check.  Recommended eating smaller high protein, low fat meals more frequently and exercising 30 mins a day 5 times a week with a goal of 10-15lb weight loss in the next 3 months. Patient voiced their understanding and motivation to adhere to these recommendations.       Elevated hemoglobin A1c    A1c 5.8% last visit, trend down.  Praised for this and for weight loss last visit.  Recommend she continue heavy focus on diet changes and regular activity.  Recheck today.      Relevant Orders   HgB A1c   Hyperlipidemia    Chronic, stable with ASCVD 7.8%.  Continue focus on diet regimen at home. Lipid panel today.  If ongoing elevation or trend up in ASCVD consider medication, she prefers not to take.      Relevant Medications   losartan (COZAAR) 100 MG tablet   Other Relevant Orders   Comprehensive metabolic panel   Lipid Panel w/o Chol/HDL Ratio   Major depressive disorder, single episode, in remission ([place]) - Primary    Chronic, stable.  Denies SI/HI.  She would like to trial reduction of Prozac, will reduce to 10 MG daily and can further reduce to every other day in 2 weeks, then continue slow reduction if tolerating -- her goal to to come off medication.  She is to alert provider via MyChart how is tolerating reduction.  Refills sent as needed.      Relevant Medications   FLUoxetine (PROZAC) 10 MG tablet   Other Relevant Orders   CBC with Differential/Platelet   TSH     Follow up plan: Return in about 6 months (around 08/23/2022) for HTN, NEUROPATHY, DEPRESSION.

## 2022-02-21 NOTE — Assessment & Plan Note (Signed)
A1c 5.8% last visit, trend down.  Praised for this and for weight loss last visit.  Recommend she continue heavy focus on diet changes and regular activity.  Recheck today.

## 2022-02-21 NOTE — Assessment & Plan Note (Signed)
Chronic, ongoing.   Was diagnosed by neurology in 2022, with severe chronic sensory polyneuropathy.  Is fearful of being w/c bound like mother who had similar, ?genetic element -- although her mother had lymphoma with radiation and chemo.  Continue collaboration with [facility] neurology team.  May also benefit from PT referral, but will alert provider if wishes to try this.  Repeat labs today CMP and TSH.  Jo Martinez declines medication.  Return in 6 months, sooner if worsening.

## 2022-02-21 NOTE — Assessment & Plan Note (Signed)
Chronic, stable.  Denies SI/HI.  She would like to trial reduction of Prozac, will reduce to 10 MG daily and can further reduce to every other day in 2 weeks, then continue slow reduction if tolerating -- her goal to to come off medication.  She is to alert provider via MyChart how is tolerating reduction.  Refills sent as needed.

## 2022-02-21 NOTE — Assessment & Plan Note (Signed)
Chronic, stable with ASCVD 7.8%.  Continue focus on diet regimen at home. Lipid panel today.  If ongoing elevation or trend up in ASCVD consider medication, she prefers not to take.

## 2022-02-22 LAB — COMPREHENSIVE METABOLIC PANEL
ALT: 12 IU/L (ref 0–32)
AST: 13 IU/L (ref 0–40)
Albumin/Globulin Ratio: 2.5 — ABNORMAL HIGH (ref 1.2–2.2)
Albumin: 4.8 g/dL (ref 3.8–4.9)
Alkaline Phosphatase: 68 IU/L (ref 44–121)
BUN/Creatinine Ratio: 20 (ref 9–23)
BUN: 15 mg/dL (ref 6–24)
Bilirubin Total: 0.3 mg/dL (ref 0.0–1.2)
CO2: 21 mmol/L (ref 20–29)
Calcium: 10 mg/dL (ref 8.7–10.2)
Chloride: 103 mmol/L (ref 96–106)
Creatinine, Ser: 0.75 mg/dL (ref 0.57–1.00)
Globulin, Total: 1.9 g/dL (ref 1.5–4.5)
Glucose: 98 mg/dL (ref 70–99)
Potassium: 3.9 mmol/L (ref 3.5–5.2)
Sodium: 139 mmol/L (ref 134–144)
Total Protein: 6.7 g/dL (ref 6.0–8.5)
eGFR: 92 mL/min/{1.73_m2} (ref >59)

## 2022-02-22 LAB — CBC WITH DIFFERENTIAL/PLATELET
Basophils Absolute: 0.1 10*3/uL (ref 0.0–0.2)
Basos: 1 %
EOS (ABSOLUTE): 0.2 10*3/uL (ref 0.0–0.4)
Eos: 2 %
Hematocrit: 41.4 % (ref 34.0–46.6)
Hemoglobin: 14.1 g/dL (ref 11.1–15.9)
Immature Grans (Abs): 0 10*3/uL (ref 0.0–0.1)
Immature Granulocytes: 0 %
Lymphocytes Absolute: 3.8 10*3/uL — ABNORMAL HIGH (ref 0.7–3.1)
Lymphs: 40 %
MCH: 31.5 pg (ref 26.6–33.0)
MCHC: 34.1 g/dL (ref 31.5–35.7)
MCV: 93 fL (ref 79–97)
Monocytes Absolute: 0.5 10*3/uL (ref 0.1–0.9)
Monocytes: 6 %
Neutrophils Absolute: 4.7 10*3/uL (ref 1.4–7.0)
Neutrophils: 51 %
Platelets: 286 10*3/uL (ref 150–450)
RBC: 4.47 x10E6/uL (ref 3.77–5.28)
RDW: 13.2 % (ref 11.7–15.4)
WBC: 9.3 10*3/uL (ref 3.4–10.8)

## 2022-02-22 LAB — LIPID PANEL W/O CHOL/HDL RATIO
Cholesterol, Total: 291 mg/dL — ABNORMAL HIGH (ref 100–199)
HDL: 38 mg/dL — ABNORMAL LOW (ref >39)
LDL Chol Calc (NIH): 177 mg/dL — ABNORMAL HIGH (ref 0–99)
Triglycerides: 384 mg/dL — ABNORMAL HIGH (ref 0–149)
VLDL Cholesterol Cal: 76 mg/dL — ABNORMAL HIGH (ref 5–40)

## 2022-02-22 LAB — HEMOGLOBIN A1C
Est. average glucose Bld gHb Est-mCnc: 126 mg/dL
Hgb A1c MFr Bld: 6 % — ABNORMAL HIGH (ref 4.8–5.6)

## 2022-02-22 LAB — TSH: TSH: 3.04 u[IU]/mL (ref 0.450–4.500)

## 2022-02-22 NOTE — Progress Notes (Signed)
Contacted via [place] afternoon Lorece, your labs have returned: - CBC remains overall stable with only mild elevation in lymphocytes, a white blood cell member, which we will continue to monitor. - Kidney function, creatinine and eGFR, remains normal, as is liver function, AST and ALT.  - Thyroid, TSH, is normal. - A1c is in prediabetic range at 6% -- continue heavy focus diet and exercise to work on lowering <5.7% and not trending up >6.5% (diabetes).  - Cholesterol levels are remaining elevated, have trended down a little -- however we do need to get these down more for stroke prevention as at this time 10 year risk score (I will place below) is >7.  Focus heavily on diet and exercise.  Add some fish oil daily to supplements.  Any questions? The 10-year ASCVD risk score (Arnett DK, et al., 2019) is: 7.3%   Values used to calculate the score:     Age: 61 years     Sex: Female     Is Non-Hispanic African American: No     Diabetic: No     Tobacco smoker: No     Systolic Blood Pressure: 312 mmHg     Is BP treated: Yes     HDL Cholesterol: 38 mg/dL     Total Cholesterol: 291 mg/dL Keep being wonderful!!  Thank you for allowing me to participate in your care.  I appreciate you. Kindest regards, Nikitas Davtyan

## 2022-02-23 DIAGNOSIS — G629 Polyneuropathy, unspecified: Secondary | ICD-10-CM | POA: Diagnosis not present

## 2022-07-05 ENCOUNTER — Encounter: Payer: Self-pay | Admitting: Nurse Practitioner

## 2022-07-05 DIAGNOSIS — G629 Polyneuropathy, unspecified: Secondary | ICD-10-CM

## 2022-07-20 ENCOUNTER — Other Ambulatory Visit (INDEPENDENT_AMBULATORY_CARE_PROVIDER_SITE_OTHER): Payer: Self-pay | Admitting: Nurse Practitioner

## 2022-07-20 ENCOUNTER — Ambulatory Visit (INDEPENDENT_AMBULATORY_CARE_PROVIDER_SITE_OTHER): Payer: Medicare HMO

## 2022-07-20 ENCOUNTER — Ambulatory Visit (INDEPENDENT_AMBULATORY_CARE_PROVIDER_SITE_OTHER): Payer: Medicare HMO | Admitting: Nurse Practitioner

## 2022-07-20 ENCOUNTER — Encounter (INDEPENDENT_AMBULATORY_CARE_PROVIDER_SITE_OTHER): Payer: Self-pay | Admitting: Nurse Practitioner

## 2022-07-20 VITALS — BP 146/94 | HR 76 | Resp 18 | Ht 66.0 in | Wt 180.2 lb

## 2022-07-20 DIAGNOSIS — G629 Polyneuropathy, unspecified: Secondary | ICD-10-CM | POA: Diagnosis not present

## 2022-07-20 DIAGNOSIS — I10 Essential (primary) hypertension: Secondary | ICD-10-CM

## 2022-07-20 DIAGNOSIS — M79606 Pain in leg, unspecified: Secondary | ICD-10-CM

## 2022-07-20 NOTE — Progress Notes (Signed)
Subjective:    Patient ID: Jo Martinez, female    DOB: 1960/11/21, 62 y.o.   MRN: [id number] Chief Complaint  Patient presents with   New Patient (Initial Visit)    np. consult.    Jo Martinez is a 62 year old female who presents today for for vascular evaluation in the setting of neuropathy.  The patient has had neuropathy for years.  She notes that she has numbness extending from her waist downwards.  She notes that she has some claudication-like symptoms primarily in her hip and thigh area where she will have some tightness and discomfort after walking for distance.  She has to stop but when she does she is able to rest and then continue to walk.  The patient also has a history of lumbar spondylosis and has been evaluated by neurosurgery.  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  She sees a health and wellness doctor who did a thermal imaging using her cell phone and it was suggested that she may have some narrowing in the arteries of her lower leg which may be a possible cause for her neuropathy.  Today the patient underwent noninvasive studies which shows an ABI of 1.18 on the right and 1.04 on the left.  The patient has normal TBI's bilaterally.  She has strong triphasic tibial artery waveforms bilaterally with good toe waveforms bilaterally.   Review of Systems  Neurological:  Positive for numbness.  All other systems reviewed and are negative.      Objective:   Physical Exam Vitals reviewed.  HENT:     Head: Normocephalic.  Cardiovascular:     Rate and Rhythm: Normal rate.     Pulses:          Dorsalis pedis pulses are 2+ on the right side and 2+ on the left side.       Posterior tibial pulses are 1+ on the right side and 1+ on the left side.  Pulmonary:     Effort: Pulmonary effort is normal.  Skin:    General: Skin is warm and dry.  Neurological:     Mental Status: She is alert and  oriented to person, place, and time.  Psychiatric:        Mood and Affect: Mood normal.        Behavior: Behavior normal.        Thought Content: Thought content normal.        Judgment: Judgment normal.    BP (!) 146/94 (BP Location: Left Arm)   Pulse 76   Resp 18   Ht 5\' 6"  (1.676 m)   Wt 180 lb 3.2 oz (81.7 kg)   LMP 01/16/2010 (Approximate)   BMI 29.09 kg/m   Past Medical History:  Diagnosis Date   Anxiety    Dental crown present    Implant - top front   Depression    Diverticulitis    Genital herpes    Hyperlipidemia    Hypertension    Sensory polyneuropathy    Sleep apnea    stopped using CPAP after wt loss(8/16)   Wears contact lenses     Social History   Socioeconomic History   Marital status: Married    Spouse name: Not on file   Number of children: Not on file   Years of education: Not on file   Highest education level: Not on file  Occupational History   Not on file  Tobacco Use   Smoking status: Never   Smokeless tobacco: Never  Vaping Use   Vaping Use: Never used  Substance and Sexual Activity   Alcohol use: No    Alcohol/week: 0.0 standard drinks of alcohol   Drug use: No   Sexual activity: Yes  Other Topics Concern   Not on file  Social History Narrative   Not on file   Social Determinants of Health   Financial Resource Strain: Not on file  Food Insecurity: Not on file  Transportation Needs: Not on file  Physical Activity: Not on file  Stress: Not on file  Social Connections: Not on file  Intimate Partner Violence: Not on file    Past Surgical History:  Procedure Laterality Date   COLON SURGERY  11/03/2015   partial colon removal    COLONOSCOPY N/A 07/06/2015   Procedure: COLONOSCOPY;  Surgeon: Wallace Cullens, MD;  Location: [facility];  Service: Gastroenterology;  Laterality: N/A;  SLEEP APNEA - no CPAP   DRAINAGE TUBE REMOVAL ([facility] HX)  10/18/14   [facility]   TONSILLECTOMY      Family History  Problem Relation Age of Onset    Lymphoma Mother    Osteoporosis Mother    Cancer Mother        lymphoma   CAD Father    Dementia Father    Heart disease Father    Hyperlipidemia Father    Hypertension Father    Arthritis Sister    Arthritis Brother    Hyperlipidemia Brother     No Known Allergies     Latest Ref Rng & Units 02/21/2022    2:35 PM 08/19/2020    3:18 PM 12/22/2019    3:58 PM  CBC  WBC 3.4 - 10.8 x10E3/uL 9.3  10.1  9.3   Hemoglobin 11.1 - 15.9 g/dL 32.4  40.1  02.7   Hematocrit 34.0 - 46.6 % 41.4  40.5  41.3   Platelets 150 - 450 x10E3/uL 286  328  299       CMP     Component Value Date/Time   NA 139 02/21/2022 1435   K 3.9 02/21/2022 1435   CL 103 02/21/2022 1435   CO2 21 02/21/2022 1435   GLUCOSE 98 02/21/2022 1435   BUN 15 02/21/2022 1435   CREATININE 0.75 02/21/2022 1435   CALCIUM 10.0 02/21/2022 1435   PROT 6.7 02/21/2022 1435   ALBUMIN 4.8 02/21/2022 1435   AST 13 02/21/2022 1435   ALT 12 02/21/2022 1435   ALKPHOS 68 02/21/2022 1435   BILITOT 0.3 02/21/2022 1435   GFRNONAA 75 12/22/2019 1558   GFRAA 87 12/22/2019 1558     No results found.     Assessment & Plan:   1. Polyneuropathy Recommend:  The patient has atypical pain symptoms for vascular disease and on exam I do not find evidence of vascular pathology that would explain the patient's symptoms.  Noninvasive studies do not identify significant vascular problems  I suspect the patient is c/o pseudoclaudication.  Additionally, I do not feel that her neuropathy is attributed to underlying vascular disease.    Patient will follow-up with me on a PRN basis.  2. Primary hypertension Continue antihypertensive medications as already ordered, these medications have been reviewed and there are no changes at this time.   Current Outpatient Medications on File Prior to Visit  Medication Sig Dispense Refill   FLUoxetine (PROZAC) 10 MG tablet Take 1 tablet (10 mg total) by mouth daily.  90 tablet 3   loratadine  (CLARITIN) 10 MG tablet Take 10 mg by mouth daily as needed.      losartan (COZAAR) 100 MG tablet Take 1 tablet (100 mg total) by mouth daily. 90 tablet 4   valACYclovir (VALTREX) 500 MG tablet Take 1 tablet (500 mg total) by mouth daily. 90 tablet 4   No current facility-administered medications on file prior to visit.    There are no Patient Instructions on file for this visit. No follow-ups on file.   Georgiana Spinner, NP

## 2022-07-25 LAB — VAS US ABI WITH/WO TBI
Left ABI: 1.04
Right ABI: 1.18

## 2022-08-24 ENCOUNTER — Ambulatory Visit: Payer: 59 | Admitting: Nurse Practitioner

## 2022-10-21 NOTE — Patient Instructions (Signed)
Prediabetes Eating Plan Prediabetes is a condition that causes blood sugar (glucose) levels to be higher than normal. This increases the risk for developing type 2 diabetes (type 2 diabetes mellitus). Working with a health care provider or nutrition specialist (dietitian) to make diet and lifestyle changes can help prevent the onset of diabetes. These changes may help you: Control your blood glucose levels. Improve your cholesterol levels. Manage your blood pressure. What are tips for following this plan? Reading food labels Read food labels to check the amount of fat, salt (sodium), and sugar in prepackaged foods. Avoid foods that have: Saturated fats. Trans fats. Added sugars. Avoid foods that have more than 300 milligrams (mg) of sodium per serving. Limit your sodium intake to less than 2,300 mg each day. Shopping Avoid buying pre-made and processed foods. Avoid buying drinks with added sugar. Cooking Cook with olive oil. Do not use butter, lard, or ghee. Bake, broil, grill, steam, or boil foods. Avoid frying. Meal planning  Work with your dietitian to create an eating plan that is right for you. This may include tracking how many calories you take in each day. Use a food diary, notebook, or mobile application to track what you eat at each meal. Consider following a Mediterranean diet. This includes: Eating several servings of fresh fruits and vegetables each day. Eating fish at least twice a week. Eating one serving each day of whole grains, beans, nuts, and seeds. Using olive oil instead of other fats. Limiting alcohol. Limiting red meat. Using nonfat or low-fat dairy products. Consider following a plant-based diet. This includes dietary choices that focus on eating mostly vegetables and fruit, grains, beans, nuts, and seeds. If you have high blood pressure, you may need to limit your sodium intake or follow a diet such as the DASH (Dietary Approaches to Stop Hypertension) eating  plan. The DASH diet aims to lower high blood pressure. Lifestyle Set weight loss goals with help from your health care team. It is recommended that most people with prediabetes lose 7% of their body weight. Exercise for at least 30 minutes 5 or more days a week. Attend a support group or seek support from a mental health counselor. Take over-the-counter and prescription medicines only as told by your health care provider. What foods are recommended? Fruits Berries. Bananas. Apples. Oranges. Grapes. Papaya. Mango. Pomegranate. Kiwi. Grapefruit. Cherries. Vegetables Lettuce. Spinach. Peas. Beets. Cauliflower. Cabbage. Broccoli. Carrots. Tomatoes. Squash. Eggplant. Herbs. Peppers. Onions. Cucumbers. Brussels sprouts. Grains Whole grains, such as whole-wheat or whole-grain breads, crackers, cereals, and pasta. Unsweetened oatmeal. Bulgur. Barley. Quinoa. Brown rice. Corn or whole-wheat flour tortillas or taco shells. Meats and other proteins Seafood. Poultry without skin. Lean cuts of pork and beef. Tofu. Eggs. Nuts. Beans. Dairy Low-fat or fat-free dairy products, such as yogurt, cottage cheese, and cheese. Beverages Water. Tea. Coffee. Sugar-free or diet soda. Seltzer water. Low-fat or nonfat milk. Milk alternatives, such as soy or almond milk. Fats and oils Olive oil. Canola oil. Sunflower oil. Grapeseed oil. Avocado. Walnuts. Sweets and desserts Sugar-free or low-fat pudding. Sugar-free or low-fat ice cream and other frozen treats. Seasonings and condiments Herbs. Sodium-free spices. Mustard. Relish. Low-salt, low-sugar ketchup. Low-salt, low-sugar barbecue sauce. Low-fat or fat-free mayonnaise. The items listed above may not be a complete list of recommended foods and beverages. Contact a dietitian for more information. What foods are not recommended? Fruits Fruits canned with syrup. Vegetables Canned vegetables. Frozen vegetables with butter or cream sauce. Grains Refined white  flour and flour   products, such as bread, pasta, snack foods, and cereals. Meats and other proteins Fatty cuts of meat. Poultry with skin. Breaded or fried meat. Processed meats. Dairy Full-fat yogurt, cheese, or milk. Beverages Sweetened drinks, such as iced tea and soda. Fats and oils Butter. Lard. Ghee. Sweets and desserts Baked goods, such as cake, cupcakes, pastries, cookies, and cheesecake. Seasonings and condiments Spice mixes with added salt. Ketchup. Barbecue sauce. Mayonnaise. The items listed above may not be a complete list of foods and beverages that are not recommended. Contact a dietitian for more information. Where to find more information American Diabetes Association: www.diabetes.org Summary You may need to make diet and lifestyle changes to help prevent the onset of diabetes. These changes can help you control blood sugar, improve cholesterol levels, and manage blood pressure. Set weight loss goals with help from your health care team. It is recommended that most people with prediabetes lose 7% of their body weight. Consider following a Mediterranean diet. This includes eating plenty of fresh fruits and vegetables, whole grains, beans, nuts, seeds, fish, and low-fat dairy, and using olive oil instead of other fats. This information is not intended to replace advice given to you by your health care provider. Make sure you discuss any questions you have with your health care provider. Document Revised: 05/14/2019 Document Reviewed: 05/14/2019 Elsevier Patient Education  2024 Elsevier Inc.  

## 2022-10-26 ENCOUNTER — Encounter: Payer: Self-pay | Admitting: Nurse Practitioner

## 2022-10-26 ENCOUNTER — Ambulatory Visit (INDEPENDENT_AMBULATORY_CARE_PROVIDER_SITE_OTHER): Payer: Medicare HMO | Admitting: Nurse Practitioner

## 2022-10-26 VITALS — BP 136/82 | HR 86 | Temp 98.2°F | Ht 66.0 in | Wt 184.4 lb

## 2022-10-26 DIAGNOSIS — Z6827 Body mass index (BMI) 27.0-27.9, adult: Secondary | ICD-10-CM

## 2022-10-26 DIAGNOSIS — E782 Mixed hyperlipidemia: Secondary | ICD-10-CM | POA: Diagnosis not present

## 2022-10-26 DIAGNOSIS — Z6829 Body mass index (BMI) 29.0-29.9, adult: Secondary | ICD-10-CM | POA: Diagnosis not present

## 2022-10-26 DIAGNOSIS — I10 Essential (primary) hypertension: Secondary | ICD-10-CM | POA: Diagnosis not present

## 2022-10-26 DIAGNOSIS — R7309 Other abnormal glucose: Secondary | ICD-10-CM | POA: Diagnosis not present

## 2022-10-26 DIAGNOSIS — G608 Other hereditary and idiopathic neuropathies: Secondary | ICD-10-CM

## 2022-10-26 DIAGNOSIS — F325 Major depressive disorder, single episode, in full remission: Secondary | ICD-10-CM

## 2022-10-26 LAB — BAYER DCA HB A1C WAIVED: HB A1C (BAYER DCA - WAIVED): 6.5 % — ABNORMAL HIGH (ref 4.8–5.6)

## 2022-10-26 NOTE — Assessment & Plan Note (Signed)
A1c 6.5% today, trend up with weight gain.  Recommend heavy focus on diet and exercise over next months, as does well with this.  Provided information on prediabetic diet.  If A1c remains 6.5% or greater next visit discussed with her we would change diagnosis to Type 2 Diabetes.

## 2022-10-26 NOTE — Assessment & Plan Note (Signed)
Chronic, stable.  Denies SI/HI. Continue current medication dosing and reduce as tolerated.  She is to alert provider via MyChart how is tolerating reduction.  Refills sent as needed.

## 2022-10-26 NOTE — Assessment & Plan Note (Signed)
BMI 29.76, some gain since last visit. Recommended eating smaller high protein, low fat meals more frequently and exercising 30 mins a day 5 times a week with a goal of 10-15lb weight loss in the next 3 months. Patient voiced their understanding and motivation to adhere to these recommendations.

## 2022-10-26 NOTE — Assessment & Plan Note (Signed)
Chronic, ongoing.   Was diagnosed by neurology in 2022, with severe chronic sensory polyneuropathy.  Is fearful of being w/c bound like mother who had similar, ?genetic element -- although her mother had lymphoma with radiation and chemo.  Continue collaboration with [facility] neurology team.  Repeat labs today CMP.  She declines medication.  Return in 6 months, sooner if worsening.

## 2022-10-26 NOTE — Assessment & Plan Note (Signed)
Chronic, stable.  BP has crept up a little with weight gain, but stable on recheck today. Will continue Losartan 100 MG daily and remain off HCTZ.  Recommend she monitor BP at least a few mornings a week at home and document.  DASH diet at home.  Labs today: CMP.  Return in 6 months.

## 2022-10-26 NOTE — Progress Notes (Signed)
BP 136/82 (BP Location: Left Arm, Patient Position: Sitting, Cuff Size: Normal)   Pulse 86   Temp 98.2 F (36.8 C) (Oral)   Ht 5\' 6"  (1.676 m)   Wt 184 lb 6.4 oz (83.6 kg)   LMP 01/16/2010 (Approximate)   SpO2 96%   BMI 29.76 kg/m    Subjective:    Patient ID: Jo Martinez, female    DOB: [birth year], 62 y.o.   MRN: [id number]  HPI: Jo Martinez is a 62 y.o. female  Chief Complaint  Patient presents with   Depression   Hypertension   HYPERTENSION / HYPERLIPIDEMIA Continues on Losartan.  No statin, refuses this. Satisfied with current treatment? yes Duration of hypertension: chronic BP monitoring frequency: rarely BP range: 130/70 average BP medication side effects: no Duration of hyperlipidemia: chronic Cholesterol supplements: none Aspirin: no Recent stressors: no Recurrent headaches: no Visual changes: no Palpitations: no Dyspnea: no Chest pain: no Lower extremity edema: no Dizzy/lightheaded: no  The 10-year ASCVD risk score (Arnett DK, et al., 2019) is: 8.5%   Values used to calculate the score:     Age: 85 years     Sex: Female     Is Non-Hispanic African American: No     Diabetic: No     Tobacco smoker: No     Systolic Blood Pressure: 136 mmHg     Is BP treated: Yes     HDL Cholesterol: 38 mg/dL     Total Cholesterol: 291 mg/dL   Impaired Fasting Glucose HbA1C:  Lab Results  Component Value Date   HGBA1C 6.0 (H) 02/21/2022  Duration of elevated blood sugar: 3 years Polydipsia: no Polyuria: no Weight change: yes Visual disturbance: no Diabetic Education: Not Completed Family history of diabetes: no   NEUROPATHY She was seen by neurology 01/09/22 -- did labs and MRI which were all normal. Her mother had similar issues.  Concerned about being like her mother who was wheel chair bound and bed ridden -- mother had lymphoma with chemo and radiation. She did have lumbar spondylosis noted on MRI, saw neurosurgery for this with no recommendation for  surgery.  If she walks a lot during day gets RLS at night.    Currently not worsening, is staying the same.  Neuropathy status: stable  Satisfied with current treatment?: yes Location:  Pain: yes Severity: mild  Quality:  tingling and pins and needles Frequency: constant Bilateral: yes Symmetric: yes Numbness: yes Decreased sensation: yes Weakness: no Context: stable Alleviating factors: no medications, has tried without benefit Aggravating factors: nothing Treatments attempted: various medications  DEPRESSION Continues on Prozac 10 MG daily, she tried weaning down to 5 MG every other day, but with the humidity in the summer was getting more irritable.  Mood status: stable Satisfied with current treatment?: yes Symptom severity: mild  Duration of current treatment : chronic Side effects: no Medication compliance: good compliance Psychotherapy/counseling: none Depressed mood: no Anxious mood: no Anhedonia: no Significant weight loss or gain: no Insomnia: occasional Fatigue: no Feelings of worthlessness or guilt: no Impaired concentration/indecisiveness: no Suicidal ideations: no Hopelessness: no Crying spells: no    10/26/2022    1:45 PM 02/21/2022    2:13 PM 08/22/2021    2:09 PM 02/21/2021    2:13 PM 08/19/2020    2:46 PM  Depression screen PHQ 2/9  Decreased Interest 0 0 0 0 0  Down, Depressed, Hopeless 0 0 0 0 0  PHQ - 2 Score 0 0 0 0 0  Altered sleeping 1 0 1 0 1  Tired, decreased energy 1 0 0 0 0  Change in appetite 0 0 0 0 0  Feeling bad or failure about yourself  0 0 0 0 0  Trouble concentrating 0 0 0 0 0  Moving slowly or fidgety/restless 0 0 0 0 0  Suicidal thoughts 0 0 0 0 0  PHQ-9 Score 2 0 1 0 1  Difficult doing work/chores Not difficult at all Not difficult at all Not difficult at all  Not difficult at all       10/26/2022    1:45 PM 02/21/2022    2:15 PM 08/22/2021    2:10 PM 02/21/2021    2:13 PM  GAD 7 : Generalized Anxiety Score   Nervous, Anxious, on Edge 0 0 0 0  Control/stop worrying 0 0 0 0  Worry too much - different things 0 0 0 0  Trouble relaxing 0 0 0 0  Restless 0 0 0 0  Easily annoyed or irritable 0 0 0 0  Afraid - awful might happen 0 0 0 0  Total GAD 7 Score 0 0 0 0  Anxiety Difficulty Not difficult at all Not difficult at all Not difficult at all Not difficult at all   Relevant past medical, surgical, family and social history reviewed and updated as indicated. Interim medical history since our last visit reviewed. Allergies and medications reviewed and updated.  Review of Systems  Constitutional:  Negative for activity change, appetite change, diaphoresis, fatigue and fever.  Respiratory:  Negative for cough, chest tightness, shortness of breath and wheezing.   Cardiovascular:  Negative for chest pain, palpitations and leg swelling.  Gastrointestinal: Negative.   Endocrine: Negative for cold intolerance and heat intolerance.  Neurological:  Positive for numbness. Negative for dizziness, syncope, weakness, light-headedness and headaches.  Psychiatric/Behavioral: Negative.      Per HPI unless specifically indicated above     Objective:    BP 136/82 (BP Location: Left Arm, Patient Position: Sitting, Cuff Size: Normal)   Pulse 86   Temp 98.2 F (36.8 C) (Oral)   Ht 5\' 6"  (1.676 m)   Wt 184 lb 6.4 oz (83.6 kg)   LMP 01/16/2010 (Approximate)   SpO2 96%   BMI 29.76 kg/m   Wt Readings from Last 3 Encounters:  10/26/22 184 lb 6.4 oz (83.6 kg)  07/20/22 180 lb 3.2 oz (81.7 kg)  02/21/22 172 lb 12.8 oz (78.4 kg)    Physical Exam Vitals and nursing note reviewed.  Constitutional:      General: She is awake. She is not in acute distress.    Appearance: She is well-developed and well-groomed. She is not ill-appearing.  HENT:     Head: Normocephalic.     Right Ear: Hearing normal.     Left Ear: Hearing normal.  Eyes:     General: Lids are normal.        Right eye: No discharge.         Left eye: No discharge.     Conjunctiva/sclera: Conjunctivae normal.     Pupils: Pupils are equal, round, and reactive to light.  Neck:     Thyroid: No thyromegaly.     Vascular: No carotid bruit.  Cardiovascular:     Rate and Rhythm: Normal rate and regular rhythm.     Heart sounds: Normal heart sounds. No murmur heard.    No gallop.  Pulmonary:     Effort: Pulmonary effort is normal.  No accessory muscle usage or respiratory distress.     Breath sounds: Normal breath sounds.  Abdominal:     General: Bowel sounds are normal.     Palpations: Abdomen is soft.  Musculoskeletal:     Cervical back: Normal range of motion and neck supple.     Right lower leg: No edema.     Left lower leg: No edema.     Right foot: Normal range of motion.     Left foot: Normal range of motion.  Lymphadenopathy:     Cervical: No cervical adenopathy.  Skin:    General: Skin is warm and dry.  Neurological:     Mental Status: She is alert and oriented to person, place, and time.     Gait: Gait is intact.     Deep Tendon Reflexes: Reflexes are normal and symmetric.     Reflex Scores:      Brachioradialis reflexes are 2+ on the right side and 2+ on the left side.      Patellar reflexes are 2+ on the right side and 2+ on the left side. Psychiatric:        Attention and Perception: Attention normal.        Mood and Affect: Mood normal.        Speech: Speech normal.        Behavior: Behavior normal. Behavior is cooperative.        Thought Content: Thought content normal.    Results for orders placed or performed in visit on 07/20/22  VAS [place] ABI WITH/WO TBI  Result Value Ref Range   Right ABI 1.18    Left ABI 1.04       Assessment & Plan:   Problem List Items Addressed This Visit       Cardiovascular and Mediastinum   Hypertension    Chronic, stable.  BP has crept up a little with weight gain, but stable on recheck today. Will continue Losartan 100 MG daily and remain off HCTZ.  Recommend she  monitor BP at least a few mornings a week at home and document.  DASH diet at home.  Labs today: CMP.  Return in 6 months.       Relevant Orders   Lipid Panel w/o Chol/HDL Ratio     Nervous and Auditory   Polyneuropathy, peripheral sensorimotor axonal    Chronic, ongoing.   Was diagnosed by neurology in 2022, with severe chronic sensory polyneuropathy.  Is fearful of being w/c bound like mother who had similar, ?genetic element -- although her mother had lymphoma with radiation and chemo.  Continue collaboration with [facility] neurology team.  Repeat labs today CMP.  She declines medication.  Return in 6 months, sooner if worsening.      Relevant Orders   Lipid Panel w/o Chol/HDL Ratio   Bayer DCA Hb A1c Waived     Other   BMI 29.0-29.9,adult    BMI 29.76, some gain since last visit. Recommended eating smaller high protein, low fat meals more frequently and exercising 30 mins a day 5 times a week with a goal of 10-15lb weight loss in the next 3 months. Patient voiced their understanding and motivation to adhere to these recommendations.       Elevated hemoglobin A1c    A1c 6.5% today, trend up with weight gain.  Recommend heavy focus on diet and exercise over next months, as does well with this.  Provided information on prediabetic diet.  If A1c remains 6.5%  or greater next visit discussed with her we would change diagnosis to Type 2 Diabetes.      Relevant Orders   Bayer DCA Hb A1c Waived   Hyperlipidemia    Chronic, stable with ASCVD 8.5%.  Continue focus on diet regimen at home. Lipid panel today.  If ongoing elevation or trend up in ASCVD consider medication, she prefers not to take.      Relevant Orders   Comprehensive metabolic panel   Lipid Panel w/o Chol/HDL Ratio   Major depressive disorder, single episode, in remission (HCC) - Primary    Chronic, stable.  Denies SI/HI. Continue current medication dosing and reduce as tolerated.  She is to alert provider via MyChart how is  tolerating reduction.  Refills sent as needed.        Follow up plan: Return in about 6 months (around 04/26/2023) for PREDIABETES, HTN/HLD, NEUROPATHY.

## 2022-10-26 NOTE — Assessment & Plan Note (Signed)
Chronic, stable with ASCVD 8.5%.  Continue focus on diet regimen at home. Lipid panel today.  If ongoing elevation or trend up in ASCVD consider medication, she prefers not to take.

## 2022-10-27 LAB — COMPREHENSIVE METABOLIC PANEL
ALT: 13 IU/L (ref 0–32)
AST: 16 IU/L (ref 0–40)
Albumin: 4.6 g/dL (ref 3.8–4.9)
Alkaline Phosphatase: 77 IU/L (ref 44–121)
BUN/Creatinine Ratio: 14 (ref 12–28)
BUN: 13 mg/dL (ref 8–27)
Bilirubin Total: 0.3 mg/dL (ref 0.0–1.2)
CO2: 21 mmol/L (ref 20–29)
Calcium: 9.6 mg/dL (ref 8.7–10.3)
Chloride: 103 mmol/L (ref 96–106)
Creatinine, Ser: 0.92 mg/dL (ref 0.57–1.00)
Globulin, Total: 2.2 g/dL (ref 1.5–4.5)
Glucose: 148 mg/dL — ABNORMAL HIGH (ref 70–99)
Potassium: 4.6 mmol/L (ref 3.5–5.2)
Sodium: 140 mmol/L (ref 134–144)
Total Protein: 6.8 g/dL (ref 6.0–8.5)
eGFR: 71 mL/min/{1.73_m2} (ref >59)

## 2022-10-27 LAB — LIPID PANEL W/O CHOL/HDL RATIO
Cholesterol, Total: 250 mg/dL — ABNORMAL HIGH (ref 100–199)
HDL: 34 mg/dL — ABNORMAL LOW (ref >39)
LDL Chol Calc (NIH): 145 mg/dL — ABNORMAL HIGH (ref 0–99)
Triglycerides: 383 mg/dL — ABNORMAL HIGH (ref 0–149)
VLDL Cholesterol Cal: 71 mg/dL — ABNORMAL HIGH (ref 5–40)

## 2022-10-28 NOTE — Progress Notes (Signed)
Contacted via MyChart The 10-year ASCVD risk score (Arnett DK, et al., 2019) is: 8.1%   Values used to calculate the score:     Age: 62 years     Sex: Female     Is Non-Hispanic African American: No     Diabetic: No     Tobacco smoker: No     Systolic Blood Pressure: 136 mmHg     Is BP treated: Yes     HDL Cholesterol: 34 mg/dL     Total Cholesterol: 250 mg/dL   Good morning Jo Martinez, your labs have returned and overall remain stable with exception of ongoing elevation in cholesterol levels.  Continue focus on diet and exercise at home at this time, but we may discuss medication if ongoing elevations.  Any questions?   Keep being amazing!!  Thank you for allowing me to participate in your care.  I appreciate you. Kindest regards, Linley Moxley

## 2022-12-12 DIAGNOSIS — H2513 Age-related nuclear cataract, bilateral: Secondary | ICD-10-CM | POA: Diagnosis not present

## 2022-12-12 DIAGNOSIS — H5213 Myopia, bilateral: Secondary | ICD-10-CM | POA: Diagnosis not present

## 2022-12-12 DIAGNOSIS — H25013 Cortical age-related cataract, bilateral: Secondary | ICD-10-CM | POA: Diagnosis not present

## 2022-12-17 ENCOUNTER — Encounter: Payer: Self-pay | Admitting: Nurse Practitioner

## 2022-12-20 DIAGNOSIS — R300 Dysuria: Secondary | ICD-10-CM | POA: Diagnosis not present

## 2022-12-20 DIAGNOSIS — N390 Urinary tract infection, site not specified: Secondary | ICD-10-CM | POA: Diagnosis not present

## 2022-12-24 ENCOUNTER — Ambulatory Visit: Payer: BC Managed Care – PPO | Admitting: Nurse Practitioner

## 2023-04-08 ENCOUNTER — Other Ambulatory Visit: Payer: Self-pay | Admitting: Nurse Practitioner

## 2023-04-09 NOTE — Telephone Encounter (Signed)
Requested Prescriptions  Pending Prescriptions Disp Refills   losartan (COZAAR) 100 MG tablet [Pharmacy Med Name: LOSARTAN POT TAB 100MG ] 90 tablet 0    Sig: TAKE 1 TABLET DAILY     Cardiovascular:  Angiotensin Receptor Blockers Passed - 04/09/2023 11:21 AM      Passed - Cr in normal range and within 180 days    Creatinine, Ser  Date Value Ref Range Status  10/26/2022 0.92 0.57 - 1.00 mg/dL Final         Passed - K in normal range and within 180 days    Potassium  Date Value Ref Range Status  10/26/2022 4.6 3.5 - 5.2 mmol/L Final         Passed - Patient is not pregnant      Passed - Last BP in normal range    BP Readings from Last 1 Encounters:  10/26/22 136/82         Passed - Valid encounter within last 6 months    Recent Outpatient Visits           5 months ago Major depressive disorder, single episode, in remission (HCC)   [place] [facility] [place], Corrie Dandy T, NP   1 year ago Major depressive disorder, single episode, in remission (HCC)   [place] [facility] [place], Corrie Dandy T, NP   1 year ago Major depressive disorder, single episode, in remission (HCC)   [place] [facility] [place], Corrie Dandy T, NP   2 years ago Major depressive disorder, single episode, in remission [facility])   [place] [facility] [place], Corrie Dandy T, NP   2 years ago Polyneuropathy   [place] [facility] [place], Dorie Rank, NP       Future Appointments             In 2 months [place], Dorie Rank, NP [facility]   In 2 months [place], Dorie Rank, NP  [facility]

## 2023-04-26 ENCOUNTER — Ambulatory Visit: Payer: BC Managed Care – PPO | Admitting: Nurse Practitioner

## 2023-04-28 NOTE — Patient Instructions (Signed)
 Be Involved in Caring For Your Health:  Taking Medications When medications are taken as directed, they can greatly improve your health. But if they are not taken as prescribed, they may not work. In some cases, not taking them correctly can be harmful. To help ensure your treatment remains effective and safe, understand your medications and how to take them. Bring your medications to each visit for review by your provider.  Your lab results, notes, and after visit summary will be available on My Chart. We strongly encourage you to use this feature. If lab results are abnormal the clinic will contact you with the appropriate steps. If the clinic does not contact you assume the results are satisfactory. You can always view your results on My Chart. If you have questions regarding your health or results, please contact the clinic during office hours. You can also ask questions on My Chart.  We at [facility] are grateful that you chose [place] to provide your care. We strive to provide evidence-based and compassionate care and are always looking for feedback. If you get a survey from the clinic please complete this so we can hear your opinions.  Prediabetes: Eating Plan Prediabetes is when your levels of blood sugar, also called glucose, are higher than normal. This can put you at risk for getting type 2 diabetes. When you have prediabetes, making healthy changes can help keep you from getting diabetes. This includes changes in your diet. Work with your health care provider or an expert in healthy eating called a dietitian. They can help you create a healthy eating plan. This plan can help you: Control your blood sugar levels. Improve your cholesterol levels. Manage your blood pressure. What are tips for following this plan? Reading food labels Read food labels to check the amount of fat and sugar in prepackaged foods. Avoid foods that have: Saturated fats. Trans fats. Added sugars. Check  food labels for the amount of salt (sodium). Avoid foods that have more than 300 milligrams (mg) of salt per serving. Limit your salt intake to less than 2,300 mg each day. Shopping Avoid buying pre-made and processed foods. Avoid buying drinks with added sugar. Cooking Cook with olive oil. Do not use: Butter. Lard. Ghee. Bake, broil, grill, steam, or boil foods. Avoid frying. Meal planning  Work with your dietitian to create an eating plan that's right for you. This may include tracking how many calories you take in each day. Use a food diary, notebook, or mobile app to track what you eat at each meal. Consider following a Mediterranean diet. This includes: Eating many servings of fresh fruits and vegetables each day. Eating fish at least twice a week. Eating one serving each day of whole grains, beans, nuts, and seeds. Using olive oil instead of other fats. Limiting alcohol. Limiting red meat. Using nonfat or low-fat dairy products. Consider following a plant-based diet. This means eating mostly: Vegetables and fruit. Grains. Beans. Nuts and seeds. If you have high blood pressure, you may need to limit your salt intake or follow a diet called the DASH eating plan. The DASH eating plan can help lower high blood pressure. Lifestyle Set weight loss goals with help from your health care team. Losing 7% of your body weight is a good goal for most people with prediabetes. Exercise for at least 30 minutes, 5 or more days a week. For support, think about joining a support group or talking with a mental health counselor. Take medicines only as  told. What foods are recommended? Fruits Berries. Bananas. Apples. Oranges. Grapes. Papaya. Mango. Pomegranate. Kiwi. Grapefruit. Cherries. Vegetables Lettuce. Spinach. Peas. Beets. Cauliflower. Cabbage. Broccoli. Carrots. Tomatoes. Squash. Eggplant. Herbs. Peppers. Onions. Cucumbers. Brussels sprouts. Grains Whole grains, such as whole-wheat  or whole-grain breads or pasta. Unsweetened oatmeal. Bulgur. Barley. Quinoa. Brown rice. Corn or whole-wheat flour tortillas or taco shells. Meats and other proteins Seafood. Poultry without skin. Lean cuts of pork and beef. Tofu. Eggs. Nuts. Beans. Dairy Low-fat or fat-free dairy products, such as yogurt, cottage cheese, and cheese. Beverages Water. Tea. Coffee. Sugar-free or diet soda. Seltzer water. Low-fat or nonfat milk. Milk alternatives, such as soy or almond milk. Fats and oils Olive oil. Canola oil. Sunflower oil. Grapeseed oil. Avocado. Walnuts. Sweets and desserts Sugar-free or low-fat pudding. Sugar-free or low-fat ice cream and other frozen treats. Seasonings and condiments Herbs. Salt-free spices. Mustard. Relish. Low-salt, low-sugar ketchup. Low-salt, low-sugar barbecue sauce. Low-fat or fat-free mayonnaise. The items listed above may not be all the foods and drinks you can have. Talk with a dietitian to learn more. What foods are not recommended? Fruits Fruits canned with syrup. Vegetables Canned vegetables. Frozen vegetables with butter or cream sauce. Grains Refined white flour and flour products, such as bread, pasta, snack foods, and cereals. Meats and other proteins Fatty cuts of meat. Poultry with skin. Breaded or fried meat. Processed meats. Dairy Full-fat yogurt, cheese, or milk. Beverages Sweetened drinks, such as iced tea and soda. Fats and oils Butter. Lard. Ghee. Sweets and desserts Baked goods, such as cake, cupcakes, pastries, cookies, and cheesecake. Seasonings and condiments Spice mixes with added salt. Ketchup. Barbecue sauce. Mayonnaise. The items listed above may not be all the foods and drinks you should avoid. Talk with a dietitian to learn more. Where to find more information American Diabetes Association: diabetes.org/food-nutrition This information is not intended to replace advice given to you by your health care provider. Make sure you  discuss any questions you have with your health care provider. Document Revised: 09/16/2022 Document Reviewed: 09/16/2022 Elsevier Patient Education  2024 [employer].

## 2023-04-30 ENCOUNTER — Encounter: Payer: Self-pay | Admitting: Nurse Practitioner

## 2023-04-30 ENCOUNTER — Ambulatory Visit: Payer: Medicare HMO | Admitting: Nurse Practitioner

## 2023-04-30 VITALS — BP 130/78 | HR 78 | Temp 98.1°F | Ht 66.0 in | Wt 180.4 lb

## 2023-04-30 DIAGNOSIS — R7309 Other abnormal glucose: Secondary | ICD-10-CM | POA: Diagnosis not present

## 2023-04-30 DIAGNOSIS — I10 Essential (primary) hypertension: Secondary | ICD-10-CM

## 2023-04-30 DIAGNOSIS — Z1231 Encounter for screening mammogram for malignant neoplasm of breast: Secondary | ICD-10-CM

## 2023-04-30 DIAGNOSIS — E782 Mixed hyperlipidemia: Secondary | ICD-10-CM | POA: Diagnosis not present

## 2023-04-30 DIAGNOSIS — F325 Major depressive disorder, single episode, in full remission: Secondary | ICD-10-CM | POA: Diagnosis not present

## 2023-04-30 DIAGNOSIS — Z124 Encounter for screening for malignant neoplasm of cervix: Secondary | ICD-10-CM

## 2023-04-30 DIAGNOSIS — Z6829 Body mass index (BMI) 29.0-29.9, adult: Secondary | ICD-10-CM

## 2023-04-30 DIAGNOSIS — G608 Other hereditary and idiopathic neuropathies: Secondary | ICD-10-CM

## 2023-04-30 LAB — MICROALBUMIN, URINE WAIVED
Creatinine, Urine Waived: 100 mg/dL (ref 10–300)
Microalb, Ur Waived: 10 mg/L (ref 0–19)
Microalb/Creat Ratio: <30 mg/g (ref <30)

## 2023-04-30 LAB — BAYER DCA HB A1C WAIVED: HB A1C (BAYER DCA - WAIVED): 6.6 % — ABNORMAL HIGH (ref 4.8–5.6)

## 2023-04-30 MED ORDER — LOSARTAN POTASSIUM-HCTZ 100-25 MG PO TABS: 1.0000 | ORAL_TABLET | Freq: Every day | ORAL | 2 refills | Status: DC

## 2023-04-30 NOTE — Assessment & Plan Note (Signed)
 Chronic, stable with ASCVD 8%.  Continue focus on diet regimen at home. Lipid panel today.  If ongoing elevation or trend up in ASCVD consider medication, she prefers not to take any medications for this.  We also discussed Calcium scoring.

## 2023-04-30 NOTE — Progress Notes (Signed)
 BP 130/78 (BP Location: Left Arm, Patient Position: Sitting, Cuff Size: Normal)   Pulse 78   Temp 98.1 F (36.7 C) (Oral)   Ht 5\' 6"  (1.676 m)   Wt 180 lb 6.4 oz (81.8 kg)   LMP 01/16/2010 (Approximate)   SpO2 98%   BMI 29.12 kg/m    Subjective:    Patient ID: Jo Martinez, female    DOB: Feb 13, 1961, 63 y.o.   MRN: [id number]  HPI: Jo Martinez is a 63 y.o. female  Chief Complaint  Patient presents with   Hypertension   HYPERTENSION / HYPERLIPIDEMIA Taking Losartan-HCTZ 100-25, she was on Losartan only and with some elevations at home return to HCTZ with this.  No statin therapy, patient prefers not to take. Satisfied with current treatment? yes Duration of hypertension: chronic BP monitoring frequency: weekly BP range: 130/70 average most often, occasionally a little higher BP medication side effects: no Duration of hyperlipidemia: chronic Cholesterol supplements: none Aspirin: no Recent stressors: no Recurrent headaches: no Visual changes: no Palpitations: no Dyspnea: no Chest pain: no Lower extremity edema: no Dizzy/lightheaded: no  The 10-year ASCVD risk score (Arnett DK, et al., 2019) is: 8%   Values used to calculate the score:     Age: 77 years     Sex: Female     Is Non-Hispanic African American: No     Diabetic: No     Tobacco smoker: No     Systolic Blood Pressure: 130 mmHg     Is BP treated: Yes     HDL Cholesterol: 34 mg/dL     Total Cholesterol: 250 mg/dL   Impaired Fasting Glucose HbA1C:  Lab Results  Component Value Date   HGBA1C 6.5 (H) 10/26/2022  Duration of elevated blood sugar: years Polydipsia: no Polyuria: no Weight change: yes Visual disturbance: no Diabetic Education: Not Completed Family history of diabetes: no   NEUROPATHY Her mother had similar issues.  She is concerned she will be like her mother who was wheel chair bound and bed ridden.  Mother had lymphoma with chemo and radiation. Lumbar spondylosis noted on MRI,  saw neurosurgery for this with no recommendation for surgery.  Saw neurology 01/09/22 -- did labs and MRI which were all normal. She reports neuropathy is currently about the same. Neuropathy status: stable  Satisfied with current treatment?: yes Location:  Pain: yes Severity: mild  Quality:  tingling and pins and needles Frequency: constant Bilateral: yes Symmetric: yes Numbness: yes Decreased sensation: yes Weakness: no Context: stable Alleviating factors: no medications, has tried multiple without benefit Aggravating factors: nothing Treatments attempted: various medications  DEPRESSION Continues without medication and overall stable. Mood status: stable Satisfied with current treatment?: yes Symptom severity: mild  Duration of current treatment : chronic Side effects: no Medication compliance: good compliance Psychotherapy/counseling: none Depressed mood: no Anxious mood: no Anhedonia: no Significant weight loss or gain: no Insomnia: occasional Fatigue: no Feelings of worthlessness or guilt: no Impaired concentration/indecisiveness: no Suicidal ideations: no Hopelessness: no Crying spells: no    04/30/2023    3:01 PM 10/26/2022    1:45 PM 02/21/2022    2:13 PM 08/22/2021    2:09 PM 02/21/2021    2:13 PM  Depression screen PHQ 2/9  Decreased Interest 0 0 0 0 0  Down, Depressed, Hopeless 0 0 0 0 0  PHQ - 2 Score 0 0 0 0 0  Altered sleeping 0 1 0 1 0  Tired, decreased energy 0 1 0 0  0  Change in appetite 0 0 0 0 0  Feeling bad or failure about yourself  0 0 0 0 0  Trouble concentrating 0 0 0 0 0  Moving slowly or fidgety/restless 0 0 0 0 0  Suicidal thoughts 0 0 0 0 0  PHQ-9 Score 0 2 0 1 0  Difficult doing work/chores Not difficult at all Not difficult at all Not difficult at all Not difficult at all        04/30/2023    3:01 PM 10/26/2022    1:45 PM 02/21/2022    2:15 PM 08/22/2021    2:10 PM  GAD 7 : Generalized Anxiety Score  Nervous, Anxious, on Edge 0  0 0 0  Control/stop worrying 0 0 0 0  Worry too much - different things 0 0 0 0  Trouble relaxing 0 0 0 0  Restless 0 0 0 0  Easily annoyed or irritable 0 0 0 0  Afraid - awful might happen 0 0 0 0  Total GAD 7 Score 0 0 0 0  Anxiety Difficulty Not difficult at all Not difficult at all Not difficult at all Not difficult at all   Relevant past medical, surgical, family and social history reviewed and updated as indicated. Interim medical history since our last visit reviewed. Allergies and medications reviewed and updated.  Review of Systems  Constitutional:  Negative for activity change, appetite change, diaphoresis, fatigue and fever.  Respiratory:  Negative for cough, chest tightness, shortness of breath and wheezing.   Cardiovascular:  Negative for chest pain, palpitations and leg swelling.  Gastrointestinal: Negative.   Endocrine: Negative for cold intolerance and heat intolerance.  Neurological:  Positive for numbness. Negative for dizziness, syncope, weakness, light-headedness and headaches.  Psychiatric/Behavioral: Negative.      Per HPI unless specifically indicated above     Objective:    BP 130/78 (BP Location: Left Arm, Patient Position: Sitting, Cuff Size: Normal)   Pulse 78   Temp 98.1 F (36.7 C) (Oral)   Ht 5\' 6"  (1.676 m)   Wt 180 lb 6.4 oz (81.8 kg)   LMP 01/16/2010 (Approximate)   SpO2 98%   BMI 29.12 kg/m   Wt Readings from Last 3 Encounters:  04/30/23 180 lb 6.4 oz (81.8 kg)  10/26/22 184 lb 6.4 oz (83.6 kg)  07/20/22 180 lb 3.2 oz (81.7 kg)    Physical Exam Vitals and nursing note reviewed.  Constitutional:      General: She is awake. She is not in acute distress.    Appearance: She is well-developed and well-groomed. She is not ill-appearing.  HENT:     Head: Normocephalic.     Right Ear: Hearing normal.     Left Ear: Hearing normal.  Eyes:     General: Lids are normal.        Right eye: No discharge.        Left eye: No discharge.      Conjunctiva/sclera: Conjunctivae normal.     Pupils: Pupils are equal, round, and reactive to light.  Neck:     Thyroid: No thyromegaly.     Vascular: No carotid bruit.  Cardiovascular:     Rate and Rhythm: Normal rate and regular rhythm.     Heart sounds: Normal heart sounds. No murmur heard.    No gallop.  Pulmonary:     Effort: Pulmonary effort is normal. No accessory muscle usage or respiratory distress.     Breath sounds: Normal breath  sounds.  Abdominal:     General: Bowel sounds are normal.     Palpations: Abdomen is soft.  Musculoskeletal:     Cervical back: Normal range of motion and neck supple.     Right lower leg: No edema.     Left lower leg: No edema.     Right foot: Normal range of motion.     Left foot: Normal range of motion.  Lymphadenopathy:     Cervical: No cervical adenopathy.  Skin:    General: Skin is warm and dry.  Neurological:     Mental Status: She is alert and oriented to person, place, and time.     Gait: Gait is intact.     Deep Tendon Reflexes: Reflexes are normal and symmetric.     Reflex Scores:      Brachioradialis reflexes are 2+ on the right side and 2+ on the left side.      Patellar reflexes are 2+ on the right side and 2+ on the left side. Psychiatric:        Attention and Perception: Attention normal.        Mood and Affect: Mood normal.        Speech: Speech normal.        Behavior: Behavior normal. Behavior is cooperative.        Thought Content: Thought content normal.    Results for orders placed or performed in visit on 10/26/22  Comprehensive metabolic panel   Collection Time: 10/26/22  1:45 PM  Result Value Ref Range   Glucose 148 (H) 70 - 99 mg/dL   BUN 13 8 - 27 mg/dL   Creatinine, Ser 1.61 0.57 - 1.00 mg/dL   eGFR 71 >09 UE/AVW/0.98   BUN/Creatinine Ratio 14 12 - 28   Sodium 140 134 - 144 mmol/L   Potassium 4.6 3.5 - 5.2 mmol/L   Chloride 103 96 - 106 mmol/L   CO2 21 20 - 29 mmol/L   Calcium 9.6 8.7 - 10.3 mg/dL    Total Protein 6.8 6.0 - 8.5 g/dL   Albumin 4.6 3.8 - 4.9 g/dL   Globulin, Total 2.2 1.5 - 4.5 g/dL   Bilirubin Total 0.3 0.0 - 1.2 mg/dL   Alkaline Phosphatase 77 44 - 121 IU/L   AST 16 0 - 40 IU/L   ALT 13 0 - 32 IU/L  Lipid Panel w/o Chol/HDL Ratio   Collection Time: 10/26/22  1:45 PM  Result Value Ref Range   Cholesterol, Total 250 (H) 100 - 199 mg/dL   Triglycerides 119 (H) 0 - 149 mg/dL   HDL 34 (L) >14 mg/dL   VLDL Cholesterol Cal 71 (H) 5 - 40 mg/dL   LDL Chol Calc (NIH) 782 (H) 0 - 99 mg/dL  Bayer DCA Hb N5A Waived   Collection Time: 10/26/22  1:45 PM  Result Value Ref Range   HB A1C (BAYER DCA - WAIVED) 6.5 (H) 4.8 - 5.6 %      Assessment & Plan:   Problem List Items Addressed This Visit       Cardiovascular and Mediastinum   Hypertension   Chronic, stable.  BP at goal today in office and at home. Will continue Losartan-HCTZ.  Recommend she monitor BP at least a few mornings a week at home and document.  DASH diet at home.  Labs today: CBC, TSH, CMP, urine ALB.  Return in 6 months.       Relevant Medications   losartan-hydrochlorothiazide (HYZAAR) 100-25  MG tablet   Other Relevant Orders   Microalbumin, Urine Waived   CBC with Differential/Platelet   Comprehensive metabolic panel   TSH     Nervous and Auditory   Polyneuropathy, peripheral sensorimotor axonal   Chronic, ongoing.  Was diagnosed by neurology in 2022, with severe chronic sensory polyneuropathy.  Is fearful of being w/c bound like mother who had similar, ?genetic element -- although her mother had lymphoma with radiation and chemo.  Continue collaboration with [facility] neurology team as needed.  Repeat labs today CMP.  She declines medication.  Return in 6 months, sooner if worsening.        Other   BMI 29.0-29.9,adult   BMI 29.12. Recommended eating smaller high protein, low fat meals more frequently and exercising 30 mins a day 5 times a week with a goal of 10-15lb weight loss in the next 3 months.  Patient voiced their understanding and motivation to adhere to these recommendations.       Elevated hemoglobin A1c   A1c 6.6% today, trend up, drinking lots of sweet tea.  Recommend heavy focus on diet and exercise over next months, as does well with this.  Provided information on prediabetic diet.  If A1c remains 6.5% or greater next visit discussed with her we would change diagnosis to Type 2 Diabetes. At present we will work on diet changes with goal to bring A1c down.      Relevant Orders   Bayer DCA Hb A1c Waived   Microalbumin, Urine Waived   Hyperlipidemia   Chronic, stable with ASCVD 8%.  Continue focus on diet regimen at home. Lipid panel today.  If ongoing elevation or trend up in ASCVD consider medication, she prefers not to take any medications for this.  We also discussed Calcium scoring.      Relevant Medications   losartan-hydrochlorothiazide (HYZAAR) 100-25 MG tablet   Other Relevant Orders   Comprehensive metabolic panel   Lipid Panel w/o Chol/HDL Ratio   Major depressive disorder, single episode, in remission (HCC) - Primary   Chronic, stable.  Denies SI/HI. Continue off medication and monitor.  Is doing well at present without medication on board.      Other Visit Diagnoses       Encounter for screening mammogram for malignant neoplasm of breast       Mammogram ordered and instructed how to schedule.   Relevant Orders   MM 3D SCREENING MAMMOGRAM BILATERAL BREAST        Follow up plan: Return in about 6 months (around 10/31/2023) for HTN/HLD, MOOD, IFG.

## 2023-04-30 NOTE — Assessment & Plan Note (Addendum)
 Chronic, ongoing.  Was diagnosed by neurology in 2022, with severe chronic sensory polyneuropathy.  Is fearful of being w/c bound like mother who had similar, ?genetic element -- although her mother had lymphoma with radiation and chemo.  Continue collaboration with [facility] neurology team as needed.  Repeat labs today CMP.  She declines medication.  Return in 6 months, sooner if worsening.

## 2023-04-30 NOTE — Assessment & Plan Note (Signed)
 Chronic, stable.  BP at goal today in office and at home. Will continue Losartan-HCTZ.  Recommend she monitor BP at least a few mornings a week at home and document.  DASH diet at home.  Labs today: CBC, TSH, CMP, urine ALB.  Return in 6 months.

## 2023-04-30 NOTE — Assessment & Plan Note (Signed)
 BMI 29.12. Recommended eating smaller high protein, low fat meals more frequently and exercising 30 mins a day 5 times a week with a goal of 10-15lb weight loss in the next 3 months. Patient voiced their understanding and motivation to adhere to these recommendations.

## 2023-04-30 NOTE — Assessment & Plan Note (Addendum)
 A1c 6.6% today, trend up, drinking lots of sweet tea.  Recommend heavy focus on diet and exercise over next months, as does well with this.  Provided information on prediabetic diet.  If A1c remains 6.5% or greater next visit discussed with her we would change diagnosis to Type 2 Diabetes. At present we will work on diet changes with goal to bring A1c down.

## 2023-04-30 NOTE — Assessment & Plan Note (Signed)
 Chronic, stable.  Denies SI/HI. Continue off medication and monitor.  Is doing well at present without medication on board.

## 2023-05-01 ENCOUNTER — Encounter: Payer: Self-pay | Admitting: Nurse Practitioner

## 2023-05-01 LAB — CBC WITH DIFFERENTIAL/PLATELET
Basophils Absolute: 0.1 10*3/uL (ref 0.0–0.2)
Basos: 1 %
EOS (ABSOLUTE): 0.2 10*3/uL (ref 0.0–0.4)
Eos: 2 %
Hematocrit: 40.1 % (ref 34.0–46.6)
Hemoglobin: 13.5 g/dL (ref 11.1–15.9)
Immature Grans (Abs): 0 10*3/uL (ref 0.0–0.1)
Immature Granulocytes: 0 %
Lymphocytes Absolute: 4.5 10*3/uL — ABNORMAL HIGH (ref 0.7–3.1)
Lymphs: 44 %
MCH: 31 pg (ref 26.6–33.0)
MCHC: 33.7 g/dL (ref 31.5–35.7)
MCV: 92 fL (ref 79–97)
Monocytes Absolute: 0.4 10*3/uL (ref 0.1–0.9)
Monocytes: 4 %
Neutrophils Absolute: 5 10*3/uL (ref 1.4–7.0)
Neutrophils: 49 %
Platelets: 281 10*3/uL (ref 150–450)
RBC: 4.35 x10E6/uL (ref 3.77–5.28)
RDW: 12.9 % (ref 11.7–15.4)
WBC: 10.2 10*3/uL (ref 3.4–10.8)

## 2023-05-01 LAB — COMPREHENSIVE METABOLIC PANEL
ALT: 12 IU/L (ref 0–32)
AST: 15 IU/L (ref 0–40)
Albumin: 4.6 g/dL (ref 3.9–4.9)
Alkaline Phosphatase: 76 IU/L (ref 44–121)
BUN/Creatinine Ratio: 11 — ABNORMAL LOW (ref 12–28)
BUN: 10 mg/dL (ref 8–27)
Bilirubin Total: 0.3 mg/dL (ref 0.0–1.2)
CO2: 24 mmol/L (ref 20–29)
Calcium: 9.9 mg/dL (ref 8.7–10.3)
Chloride: 99 mmol/L (ref 96–106)
Creatinine, Ser: 0.89 mg/dL (ref 0.57–1.00)
Globulin, Total: 2 g/dL (ref 1.5–4.5)
Glucose: 147 mg/dL — ABNORMAL HIGH (ref 70–99)
Potassium: 3.6 mmol/L (ref 3.5–5.2)
Sodium: 137 mmol/L (ref 134–144)
Total Protein: 6.6 g/dL (ref 6.0–8.5)
eGFR: 74 mL/min/{1.73_m2} (ref >59)

## 2023-05-01 LAB — TSH: TSH: 2.38 u[IU]/mL (ref 0.450–4.500)

## 2023-05-01 LAB — LIPID PANEL W/O CHOL/HDL RATIO
Cholesterol, Total: 241 mg/dL — ABNORMAL HIGH (ref 100–199)
HDL: 32 mg/dL — ABNORMAL LOW (ref >39)
LDL Chol Calc (NIH): 119 mg/dL — ABNORMAL HIGH (ref 0–99)
Triglycerides: 509 mg/dL — ABNORMAL HIGH (ref 0–149)
VLDL Cholesterol Cal: 90 mg/dL — ABNORMAL HIGH (ref 5–40)

## 2023-05-01 NOTE — Progress Notes (Signed)
 Contacted via MyChart The 10-year ASCVD risk score (Arnett DK, et al., 2019) is: 8.1%   Values used to calculate the score:     Age: 63 years     Sex: Female     Is Non-Hispanic African American: No     Diabetic: No     Tobacco smoker: No     Systolic Blood Pressure: 130 mmHg     Is BP treated: Yes     HDL Cholesterol: 32 mg/dL     Total Cholesterol: 241 mg/dL   Good morning Jo Martinez, your labs have returned: - CBC overall stable, continues to show very mild elevation in lymphocytes (a white blood cell), this has been present as far back as October 2016 -- not consistent but present. We will continue to monitor and if any trends up then may send you to hematology. - Kidney function, creatinine and eGFR, remains normal, as is liver function, AST and ALT.  - Glucose does remain elevated as we discussed, if A1c continues in current range or trends up next visit I would change to diabetes on problem list.  For now focus heavily on diet and regular activity. - Lipid panel continues to show elevations, but LDL has come down a little.  You are still in range where statin is recommended, as discussed yesterday.  Focus on diet at home.  The American Heart and American Diabetes Association websites have a plethora of information on diet changes and recipes too. - TSH, thyroid level, is normal.  Any questions? Keep being stellar!!  Thank you for allowing me to participate in your care.  I appreciate you. Kindest regards, Anni Hocevar

## 2023-05-21 ENCOUNTER — Encounter: Payer: Self-pay | Admitting: Nurse Practitioner

## 2023-05-21 MED ORDER — FLUOXETINE HCL 10 MG PO TABS: 10.0000 mg | ORAL_TABLET | Freq: Every day | ORAL | 3 refills | Status: DC

## 2023-06-17 ENCOUNTER — Ambulatory Visit: Payer: Self-pay | Admitting: Nurse Practitioner

## 2023-06-25 ENCOUNTER — Other Ambulatory Visit: Payer: Self-pay | Admitting: Nurse Practitioner

## 2023-06-26 ENCOUNTER — Ambulatory Visit: Payer: Self-pay | Admitting: Nurse Practitioner

## 2023-06-28 NOTE — Telephone Encounter (Signed)
 Discontinued 04/28/23, patient preference.  Requested Prescriptions  Pending Prescriptions Disp Refills   losartan  (COZAAR ) 100 MG tablet [Pharmacy Med Name: LOSARTAN  POT TAB 100MG ] 90 tablet 0    Sig: TAKE 1 TABLET DAILY     Cardiovascular:  Angiotensin Receptor Blockers Passed - 06/28/2023  1:41 PM      Passed - Cr in normal range and within 180 days    Creatinine, Ser  Date Value Ref Range Status  04/30/2023 0.89 0.57 - 1.00 mg/dL Final         Passed - K in normal range and within 180 days    Potassium  Date Value Ref Range Status  04/30/2023 3.6 3.5 - 5.2 mmol/L Final         Passed - Patient is not pregnant      Passed - Last BP in normal range    BP Readings from Last 1 Encounters:  04/30/23 130/78         Passed - Valid encounter within last 6 months    Recent Outpatient Visits           1 month ago Major depressive disorder, single episode, in remission (HCC)   [facility] [place], Lavelle Posey, NP       Future Appointments             In 4 months Cannady, Jolene T, NP [facility]

## 2023-07-16 ENCOUNTER — Encounter (INDEPENDENT_AMBULATORY_CARE_PROVIDER_SITE_OTHER): Payer: Self-pay

## 2023-07-31 ENCOUNTER — Telehealth: Payer: Self-pay | Admitting: Nurse Practitioner

## 2023-07-31 NOTE — Telephone Encounter (Signed)
 Copied from CRM [id number]. Topic: Medicare AWV >> Jul 31, 2023  1:24 PM Juliana Ocean wrote: Reason for CRM: LVM 07/31/2023 to schedule AWV. Please schedule Virtual or Telehealth visits ONLY  Rosalee Collins; Care Guide Ambulatory Clinical Support [place] [facility] Direct Dial: [phone]

## 2023-11-06 NOTE — Patient Instructions (Signed)
 Be Involved in Caring For Your Health:  Taking Medications When medications are taken as directed, they can greatly improve your health. But if they are not taken as prescribed, they may not work. In some cases, not taking them correctly can be harmful. To help ensure your treatment remains effective and safe, understand your medications and how to take them. Bring your medications to each visit for review by your provider.  Your lab results, notes, and after visit summary will be available on My Chart. We strongly encourage you to use this feature. If lab results are abnormal the clinic will contact you with the appropriate steps. If the clinic does not contact you assume the results are satisfactory. You can always view your results on My Chart. If you have questions regarding your health or results, please contact the clinic during office hours. You can also ask questions on My Chart.  We at [facility] are grateful that you chose [place] to provide your care. We strive to provide evidence-based and compassionate care and are always looking for feedback. If you get a survey from the clinic please complete this so we can hear your opinions.  Prediabetes: What to Know Prediabetes is when your blood sugar, also called glucose, is at a higher level than normal but not high enough for you to be diagnosed with type 2 diabetes (type 2 diabetes mellitus). Having prediabetes puts you at risk for getting type 2 diabetes. By making some healthy changes, you may be able to prevent or delay getting type 2 diabetes. This is important because type 2 diabetes can lead to serious problems. Some of these include: Heart disease. Stroke. Blindness. Kidney disease. Depression. Poor blood flow in the feet and legs. In very bad cases, this could lead to having a leg removed by surgery (amputation). What are the causes? The exact cause of prediabetes isn't known. It may result from insulin resistance. Insulin  resistance happens when cells in the body don't respond properly to insulin that the body makes. This can cause too much sugar to build up in the blood. High blood sugar, also called hyperglycemia, can develop. What increases the risk? Having a family member with type 2 diabetes. Being older than 61 years of age. Having had a temporary form of diabetes during a pregnancy. This is called gestational diabetes. Having had polycystic ovary syndrome (PCOS). Being overweight or obese. Being inactive and not getting much exercise. Having a history of heart disease. This may include problems with cholesterol levels, high levels of blood fats, or high blood pressure. What are the signs or symptoms? You may have no symptoms. If you do have symptoms, they may include: Increased hunger. Increased thirst. Needing to pee more often. Changes in how you see, like blurry vision. Feeling tired. How is this diagnosed? Prediabetes can be diagnosed with blood tests that check your blood sugar. One or more of these tests may be done: A fasting blood glucose (FBG) test. You won't be allowed to eat (you will fast) for at least 8 hours before a blood sample is taken. An A1C blood test, also called a hemoglobin A1C test. This test shows information about blood sugar levels over the past 2?3 months. An oral glucose tolerance test (OGTT). This test measures your blood sugar at two points in time: After you haven't eaten for a while. This is your baseline level. Two hours after you drink a beverage that has sugar in it. You may be diagnosed with prediabetes  if: Your FBG is 100?125 mg/dL (1.6-1.0 mmol/L). Your A1C level is 5.7?6.4% (39-46 mmol/mol). Your OGTT result is 140?199 mg/dL (9.6-04 mmol/L). These blood tests may need to be done again to be sure of the diagnosis. How is this treated? Treatment may include making changes to your diet and lifestyle. These changes can help lower your blood sugar and keep you  from getting type 2 diabetes. In some cases, medicine may be given to help lower your risk. Follow these instructions at home: Eating and drinking  Eat and drink as told. Follow a healthy meal plan. This includes eating lean proteins, whole grains, legumes, fresh fruits and vegetables, low-fat dairy products, and healthy fats. Meet with an expert in healthy eating called a dietitian. This person can help create a healthy eating plan that's right for you. Lifestyle Do moderate-intensity exercise. Do this for at least 30 minutes a day on 5 or more days each week, or as told by your health care provider. A mix of activities may be best. Good choices include brisk walking, swimming, biking, and weight lifting. Try to lose weight if your provider says it's OK. Losing 5-7% of your body weight can help reverse insulin resistance. Do not drink alcohol if: Your provider tells you not to drink. You're pregnant, may be pregnant, or plan to become pregnant. If you drink alcohol: Limit how much you have to: 0-1 drink a day if you're female. 0-2 drinks a day if you're female. Know how much alcohol is in your drink. In the [place] one drink is one 12 oz bottle of beer (355 mL), one 5 oz glass of wine (148 mL), or one 1 oz glass of hard liquor (44 mL). General instructions Take medicines only as told. You may be given medicines that help lower the risk of type 2 diabetes. Do not smoke, vape, or use nicotine or tobacco. Where to find more information American Diabetes Association: diabetes.org/about-diabetes/prediabetes Academy of Nutrition and Dietetics: eatright.org American Heart Association: Go to ThisJobs.cz. Click the search icon. Type "prediabetes" in the search box. Contact a health care provider if: You have any of these symptoms: Increased hunger. Peeing more often than usual. Increased thirst. Feeling tired. Changes in how you see, like blurry vision. Feeling like you may throw  up. Throwing up. Get help right away if: You have shortness of breath. You feel confused. This information is not intended to replace advice given to you by your health care provider. Make sure you discuss any questions you have with your health care provider. Document Revised: 09/16/2022 Document Reviewed: 09/16/2022 Elsevier Patient Education  2024 [employer].

## 2023-11-08 ENCOUNTER — Ambulatory Visit (INDEPENDENT_AMBULATORY_CARE_PROVIDER_SITE_OTHER): Admitting: Nurse Practitioner

## 2023-11-08 ENCOUNTER — Encounter: Payer: Self-pay | Admitting: Nurse Practitioner

## 2023-11-08 VITALS — BP 122/70 | HR 71 | Temp 98.1°F | Resp 15 | Ht 65.98 in | Wt 173.2 lb

## 2023-11-08 DIAGNOSIS — E782 Mixed hyperlipidemia: Secondary | ICD-10-CM | POA: Diagnosis not present

## 2023-11-08 DIAGNOSIS — F325 Major depressive disorder, single episode, in full remission: Secondary | ICD-10-CM | POA: Diagnosis not present

## 2023-11-08 DIAGNOSIS — G608 Other hereditary and idiopathic neuropathies: Secondary | ICD-10-CM | POA: Diagnosis not present

## 2023-11-08 DIAGNOSIS — I10 Essential (primary) hypertension: Secondary | ICD-10-CM | POA: Diagnosis not present

## 2023-11-08 DIAGNOSIS — Z6829 Body mass index (BMI) 29.0-29.9, adult: Secondary | ICD-10-CM

## 2023-11-08 DIAGNOSIS — R7309 Other abnormal glucose: Secondary | ICD-10-CM

## 2023-11-08 DIAGNOSIS — Z6827 Body mass index (BMI) 27.0-27.9, adult: Secondary | ICD-10-CM

## 2023-11-08 LAB — BAYER DCA HB A1C WAIVED: HB A1C (BAYER DCA - WAIVED): 6.3 % — ABNORMAL HIGH (ref 4.8–5.6)

## 2023-11-08 NOTE — Assessment & Plan Note (Signed)
 A1c 6.3% today, trending down.  Recommend heavy focus on diet and exercise over next months, as does well with this.  Provided information on prediabetic diet.  If A1c maintains above 6.5% or greater future visits discussed with her we would change diagnosis to Type 2 Diabetes. At present we will work on diet changes with goal to continue to bring A1c down.

## 2023-11-08 NOTE — Assessment & Plan Note (Signed)
 Chronic, stable with ASCVD 7.1%.  Continue focus on diet regimen at home. Lipid panel today.  If ongoing elevation or trend up in ASCVD consider medication, she prefers not to take any medications for this.  We also discussed Calcium scoring.

## 2023-11-08 NOTE — Assessment & Plan Note (Signed)
 Chronic, stable.  Denies SI/HI. Continue on Prozac  as needed, often uses more in summer time.  Refills as needed.

## 2023-11-08 NOTE — Assessment & Plan Note (Signed)
 Chronic, stable.  BP at goal today in office on recheck and at home. Will continue Losartan -HCTZ.  Recommend she monitor BP at least a few mornings a week at home and document.  DASH diet at home.  Labs today: CMP.  Return in 6 months.

## 2023-11-08 NOTE — Assessment & Plan Note (Signed)
 Chronic, ongoing.  Was diagnosed by neurology in 2022, with severe chronic sensory polyneuropathy.  Is fearful of being w/c bound like mother who had similar, ?genetic element -- although her mother had lymphoma with radiation and chemo.  Continue collaboration with [facility] neurology team as needed.  Repeat labs today CMP.  She declines medication.  Return in 6 months, sooner if worsening.

## 2023-11-08 NOTE — Progress Notes (Signed)
 BP 122/70 (BP Location: Left Arm, Patient Position: Sitting, Cuff Size: Normal)   Pulse 71   Temp 98.1 F (36.7 C) (Oral)   Resp 15   Ht 5' 5.98 (1.676 m)   Wt 173 lb 3.2 oz (78.6 kg)   LMP 01/16/2010 (Approximate)   SpO2 97%   BMI 27.97 kg/m    Subjective:    Patient ID: Jo Martinez, female    DOB: Jul 11, 1960, 63 y.o.   MRN: [id number]  HPI: Jo Martinez is a 63 y.o. female  Chief Complaint  Patient presents with   Mood    In a good mood. No current concerns doing well on her mediation.    Impaired fasting glucose   Hypertension/hyperlipidema    Some home checks. Ranging a little above normal.    HYPERTENSION / HYPERLIPIDEMIA Taking Losartan -HCTZ.  No statin, refuses this. Has lost 7 lbs since last visit, 11 lbs in past year. Has cut back on sweet tea. Satisfied with current treatment? yes Duration of hypertension: chronic BP monitoring frequency: rarely BP range: 120/81 on last check BP medication side effects: no Duration of hyperlipidemia: chronic Cholesterol supplements: none Aspirin: no Recent stressors: no Recurrent headaches: no Visual changes: no Palpitations: no Dyspnea: no Chest pain: no Lower extremity edema: no Dizzy/lightheaded: no  The 10-year ASCVD risk score (Arnett DK, et al., 2019) is: 7.1%   Values used to calculate the score:     Age: 6 years     Clincally relevant sex: Female     Is Non-Hispanic African American: No     Diabetic: No     Tobacco smoker: No     Systolic Blood Pressure: 122 mmHg     Is BP treated: Yes     HDL Cholesterol: 32 mg/dL     Total Cholesterol: 241 mg/dL   Impaired Fasting Glucose HbA1C:  Lab Results  Component Value Date   HGBA1C 6.6 (H) 04/30/2023  Duration of elevated blood sugar: chronic Polydipsia: no Polyuria: no Weight change: yes Visual disturbance: no Diabetic Education: Not Completed Family history of diabetes: no   NEUROPATHY Saw neurology last 01/09/22 , labs and MRI which were all  normal. Mother had similar issues.  Has concerns about being like her mother.  Mother had lymphoma with chemo and radiation. Has  lumbar spondylosis noted on MRI, saw neurosurgery for this with no recommendation for surgery.  If she walks a lot during day gets RLS at night.    Currently not worsening, is staying the same.  Neuropathy status: stable  Satisfied with current treatment?: yes Location:  Pain: yes Severity: mild  Quality:  tingling and pins and needles Frequency: constant Bilateral: yes Symmetric: yes Numbness: yes Decreased sensation: yes Weakness: no Context: stable Alleviating factors: no medications, has tried without benefit Aggravating factors: nothing Treatments attempted: various medications  DEPRESSION Taking Prozac  10 MG daily. Mood status: stable Satisfied with current treatment?: yes Symptom severity: mild  Duration of current treatment : chronic Side effects: no Medication compliance: good compliance Psychotherapy/counseling: none Depressed mood: no Anxious mood: no Anhedonia: no Significant weight loss or gain: no Insomnia: occasional, rarely Fatigue: no Feelings of worthlessness or guilt: no Impaired concentration/indecisiveness: no Suicidal ideations: no Hopelessness: no Crying spells: no    11/08/2023    2:09 PM 04/30/2023    3:01 PM 10/26/2022    1:45 PM 02/21/2022    2:13 PM 08/22/2021    2:09 PM  Depression screen PHQ 2/9  Decreased Interest 0  0 0 0 0  Down, Depressed, Hopeless 0 0 0 0 0  PHQ - 2 Score 0 0 0 0 0  Altered sleeping 0 0 1 0 1  Tired, decreased energy 0 0 1 0 0  Change in appetite 0 0 0 0 0  Feeling bad or failure about yourself  0 0 0 0 0  Trouble concentrating 0 0 0 0 0  Moving slowly or fidgety/restless 0 0 0 0 0  Suicidal thoughts 0 0 0 0 0  PHQ-9 Score 0 0 2 0 1  Difficult doing work/chores  Not difficult at all Not difficult at all Not difficult at all Not difficult at all       11/08/2023    2:10 PM 04/30/2023     3:01 PM 10/26/2022    1:45 PM 02/21/2022    2:15 PM  GAD 7 : Generalized Anxiety Score  Nervous, Anxious, on Edge 0 0 0 0  Control/stop worrying 0 0 0 0  Worry too much - different things 0 0 0 0  Trouble relaxing 0 0 0 0  Restless 0 0 0 0  Easily annoyed or irritable 0 0 0 0  Afraid - awful might happen 0 0 0 0  Total GAD 7 Score 0 0 0 0  Anxiety Difficulty  Not difficult at all Not difficult at all Not difficult at all   Relevant past medical, surgical, family and social history reviewed and updated as indicated. Interim medical history since our last visit reviewed. Allergies and medications reviewed and updated.  Review of Systems  Constitutional:  Negative for activity change, appetite change, diaphoresis, fatigue and fever.  Respiratory:  Negative for cough, chest tightness, shortness of breath and wheezing.   Cardiovascular:  Negative for chest pain, palpitations and leg swelling.  Gastrointestinal: Negative.   Endocrine: Negative for cold intolerance and heat intolerance.  Neurological:  Positive for numbness. Negative for dizziness, syncope, weakness, light-headedness and headaches.  Psychiatric/Behavioral: Negative.      Per HPI unless specifically indicated above     Objective:    BP 122/70 (BP Location: Left Arm, Patient Position: Sitting, Cuff Size: Normal)   Pulse 71   Temp 98.1 F (36.7 C) (Oral)   Resp 15   Ht 5' 5.98 (1.676 m)   Wt 173 lb 3.2 oz (78.6 kg)   LMP 01/16/2010 (Approximate)   SpO2 97%   BMI 27.97 kg/m   Wt Readings from Last 3 Encounters:  11/08/23 173 lb 3.2 oz (78.6 kg)  04/30/23 180 lb 6.4 oz (81.8 kg)  10/26/22 184 lb 6.4 oz (83.6 kg)    Physical Exam Vitals and nursing note reviewed.  Constitutional:      General: She is awake. She is not in acute distress.    Appearance: She is well-developed and well-groomed. She is not ill-appearing.  HENT:     Head: Normocephalic.     Right Ear: Hearing normal.     Left Ear: Hearing  normal.  Eyes:     General: Lids are normal.        Right eye: No discharge.        Left eye: No discharge.     Conjunctiva/sclera: Conjunctivae normal.     Pupils: Pupils are equal, round, and reactive to light.  Neck:     Thyroid : No thyromegaly.     Vascular: No carotid bruit.  Cardiovascular:     Rate and Rhythm: Normal rate and regular rhythm.  Heart sounds: Normal heart sounds. No murmur heard.    No gallop.  Pulmonary:     Effort: Pulmonary effort is normal. No accessory muscle usage or respiratory distress.     Breath sounds: Normal breath sounds.  Abdominal:     General: Bowel sounds are normal.     Palpations: Abdomen is soft.  Musculoskeletal:     Cervical back: Normal range of motion and neck supple.     Right lower leg: No edema.     Left lower leg: No edema.     Right foot: Normal range of motion.     Left foot: Normal range of motion.  Lymphadenopathy:     Cervical: No cervical adenopathy.  Skin:    General: Skin is warm and dry.  Neurological:     Mental Status: She is alert and oriented to person, place, and time.     Gait: Gait is intact.     Deep Tendon Reflexes: Reflexes are normal and symmetric.     Reflex Scores:      Brachioradialis reflexes are 2+ on the right side and 2+ on the left side.      Patellar reflexes are 2+ on the right side and 2+ on the left side. Psychiatric:        Attention and Perception: Attention normal.        Mood and Affect: Mood normal.        Speech: Speech normal.        Behavior: Behavior normal. Behavior is cooperative.        Thought Content: Thought content normal.    Results for orders placed or performed in visit on 04/30/23  Bayer DCA Hb A1c Waived   Collection Time: 04/30/23  2:53 PM  Result Value Ref Range   HB A1C (BAYER DCA - WAIVED) 6.6 (H) 4.8 - 5.6 %  Microalbumin, Urine Waived   Collection Time: 04/30/23  2:53 PM  Result Value Ref Range   Microalb, Ur Waived 10 0 - 19 mg/L   Creatinine, Urine  Waived 100 10 - 300 mg/dL   Microalb/Creat Ratio <30 <30 mg/g  CBC with Differential/Platelet   Collection Time: 04/30/23  2:54 PM  Result Value Ref Range   WBC 10.2 3.4 - 10.8 x10E3/uL   RBC 4.35 3.77 - 5.28 x10E6/uL   Hemoglobin 13.5 11.1 - 15.9 g/dL   Hematocrit 59.8 65.9 - 46.6 %   MCV 92 79 - 97 fL   MCH 31.0 26.6 - 33.0 pg   MCHC 33.7 31.5 - 35.7 g/dL   RDW 87.0 88.2 - 84.5 %   Platelets 281 150 - 450 x10E3/uL   Neutrophils 49 Not Estab. %   Lymphs 44 Not Estab. %   Monocytes 4 Not Estab. %   Eos 2 Not Estab. %   Basos 1 Not Estab. %   Neutrophils Absolute 5.0 1.4 - 7.0 x10E3/uL   Lymphocytes Absolute 4.5 (H) 0.7 - 3.1 x10E3/uL   Monocytes Absolute 0.4 0.1 - 0.9 x10E3/uL   EOS (ABSOLUTE) 0.2 0.0 - 0.4 x10E3/uL   Basophils Absolute 0.1 0.0 - 0.2 x10E3/uL   Immature Granulocytes 0 Not Estab. %   Immature Grans (Abs) 0.0 0.0 - 0.1 x10E3/uL  Comprehensive metabolic panel   Collection Time: 04/30/23  2:54 PM  Result Value Ref Range   Glucose 147 (H) 70 - 99 mg/dL   BUN 10 8 - 27 mg/dL   Creatinine, Ser 9.10 0.57 - 1.00 mg/dL   eGFR  74 >59 mL/min/1.73   BUN/Creatinine Ratio 11 (L) 12 - 28   Sodium 137 134 - 144 mmol/L   Potassium 3.6 3.5 - 5.2 mmol/L   Chloride 99 96 - 106 mmol/L   CO2 24 20 - 29 mmol/L   Calcium 9.9 8.7 - 10.3 mg/dL   Total Protein 6.6 6.0 - 8.5 g/dL   Albumin 4.6 3.9 - 4.9 g/dL   Globulin, Total 2.0 1.5 - 4.5 g/dL   Bilirubin Total 0.3 0.0 - 1.2 mg/dL   Alkaline Phosphatase 76 44 - 121 IU/L   AST 15 0 - 40 IU/L   ALT 12 0 - 32 IU/L  TSH   Collection Time: 04/30/23  2:54 PM  Result Value Ref Range   TSH 2.380 0.450 - 4.500 uIU/mL  Lipid Panel w/o Chol/HDL Ratio   Collection Time: 04/30/23  2:54 PM  Result Value Ref Range   Cholesterol, Total 241 (H) 100 - 199 mg/dL   Triglycerides 490 (H) 0 - 149 mg/dL   HDL 32 (L) >60 mg/dL   VLDL Cholesterol Cal 90 (H) 5 - 40 mg/dL   LDL Chol Calc (NIH) 880 (H) 0 - 99 mg/dL      Assessment & Plan:    Problem List Items Addressed This Visit       Cardiovascular and Mediastinum   Hypertension   Chronic, stable.  BP at goal today in office on recheck and at home. Will continue Losartan -HCTZ.  Recommend she monitor BP at least a few mornings a week at home and document.  DASH diet at home.  Labs today: CMP.  Return in 6 months.         Nervous and Auditory   Polyneuropathy, peripheral sensorimotor axonal   Chronic, ongoing.  Was diagnosed by neurology in 2022, with severe chronic sensory polyneuropathy.  Is fearful of being w/c bound like mother who had similar, ?genetic element -- although her mother had lymphoma with radiation and chemo.  Continue collaboration with [facility] neurology team as needed.  Repeat labs today CMP.  She declines medication.  Return in 6 months, sooner if worsening.        Other   Major depressive disorder, single episode, in remission (HCC) - Primary   Chronic, stable.  Denies SI/HI. Continue on Prozac  as needed, often uses more in summer time.  Refills as needed.      Hyperlipidemia   Chronic, stable with ASCVD 7.1%.  Continue focus on diet regimen at home. Lipid panel today.  If ongoing elevation or trend up in ASCVD consider medication, she prefers not to take any medications for this.  We also discussed Calcium scoring.      Relevant Orders   Comprehensive metabolic panel with GFR   Lipid Panel w/o Chol/HDL Ratio   Elevated hemoglobin A1c   A1c 6.3% today, trending down.  Recommend heavy focus on diet and exercise over next months, as does well with this.  Provided information on prediabetic diet.  If A1c maintains above 6.5% or greater future visits discussed with her we would change diagnosis to Type 2 Diabetes. At present we will work on diet changes with goal to continue to bring A1c down.      Relevant Orders   Bayer DCA Hb A1c Waived   BMI 27.0-27.9,adult   BMI 27.97. Recommended eating smaller high protein, low fat meals more frequently and  exercising 30 mins a day 5 times a week with a goal of 10-15lb weight loss in the  next 3 months. Patient voiced their understanding and motivation to adhere to these recommendations.         Follow up plan: Return for Annual Physical after 04/29/24 + needs Medicare Wellness scheduled with nurse.

## 2023-11-08 NOTE — Assessment & Plan Note (Signed)
 BMI 27.97. Recommended eating smaller high protein, low fat meals more frequently and exercising 30 mins a day 5 times a week with a goal of 10-15lb weight loss in the next 3 months. Patient voiced their understanding and motivation to adhere to these recommendations.

## 2023-11-09 ENCOUNTER — Ambulatory Visit: Payer: Self-pay | Admitting: Nurse Practitioner

## 2023-11-09 LAB — LIPID PANEL W/O CHOL/HDL RATIO
Cholesterol, Total: 268 mg/dL — ABNORMAL HIGH (ref 100–199)
HDL: 32 mg/dL — ABNORMAL LOW (ref >39)
LDL Chol Calc (NIH): 151 mg/dL — ABNORMAL HIGH (ref 0–99)
Triglycerides: 451 mg/dL — ABNORMAL HIGH (ref 0–149)
VLDL Cholesterol Cal: 85 mg/dL — ABNORMAL HIGH (ref 5–40)

## 2023-11-09 LAB — COMPREHENSIVE METABOLIC PANEL WITH GFR
ALT: 8 IU/L (ref 0–32)
AST: 13 IU/L (ref 0–40)
Albumin: 4.7 g/dL (ref 3.9–4.9)
Alkaline Phosphatase: 68 IU/L (ref 44–121)
BUN/Creatinine Ratio: 16 (ref 12–28)
BUN: 15 mg/dL (ref 8–27)
Bilirubin Total: 0.3 mg/dL (ref 0.0–1.2)
CO2: 23 mmol/L (ref 20–29)
Calcium: 9.8 mg/dL (ref 8.7–10.3)
Chloride: 101 mmol/L (ref 96–106)
Creatinine, Ser: 0.94 mg/dL (ref 0.57–1.00)
Globulin, Total: 1.9 g/dL (ref 1.5–4.5)
Glucose: 114 mg/dL — ABNORMAL HIGH (ref 70–99)
Potassium: 4.4 mmol/L (ref 3.5–5.2)
Sodium: 137 mmol/L (ref 134–144)
Total Protein: 6.6 g/dL (ref 6.0–8.5)
eGFR: 69 mL/min/1.73 (ref >59)

## 2023-11-09 NOTE — Progress Notes (Signed)
 Contacted via MyChart The 10-year ASCVD risk score (Arnett DK, et al., 2019) is: 7.7%   Values used to calculate the score:     Age: 63 years     Clincally relevant sex: Female     Is Non-Hispanic African American: No     Diabetic: No     Tobacco smoker: No     Systolic Blood Pressure: 122 mmHg     Is BP treated: Yes     HDL Cholesterol: 32 mg/dL     Total Cholesterol: 268 mg/dL  Good morning Makeisha, your labs have returned: - Kidney function, creatinine and eGFR, remains normal, as is liver function, AST and ALT.  - Lipid panel is showing elevations, especially triglycerides.  Were you fasting? This does put you in a place where statin therapy is recommended based on numbers, to help prevent stroke or heart attack.  We would like your LDL <70.  Would you like to trial a low dose of Rosuvastatin? Let me know.  I also highly recommend you work on diet changes and take Omega 3 supplement daily.  Any questions? Keep being stellar!!  Thank you for allowing me to participate in your care.  I appreciate you. Kindest regards, Navjot Pilgrim

## 2023-12-02 ENCOUNTER — Other Ambulatory Visit: Payer: Self-pay | Admitting: Nurse Practitioner

## 2023-12-04 NOTE — Telephone Encounter (Signed)
 Too soon for refill, LRF 04/30/23 for 90 and 2 RF.  Requested Prescriptions  Pending Prescriptions Disp Refills   losartan -hydrochlorothiazide (HYZAAR) 100-25 MG tablet [Pharmacy Med Name: LOSARTAN /HCT TAB 100-25] 90 tablet 2    Sig: TAKE 1 TABLET DAILY     Cardiovascular: ARB + Diuretic Combos Passed - 12/04/2023 12:37 PM      Passed - K in normal range and within 180 days    Potassium  Date Value Ref Range Status  11/08/2023 4.4 3.5 - 5.2 mmol/L Final         Passed - Na in normal range and within 180 days    Sodium  Date Value Ref Range Status  11/08/2023 137 134 - 144 mmol/L Final         Passed - Cr in normal range and within 180 days    Creatinine, Ser  Date Value Ref Range Status  11/08/2023 0.94 0.57 - 1.00 mg/dL Final         Passed - eGFR is 10 or above and within 180 days    GFR calc Af Amer  Date Value Ref Range Status  12/22/2019 87 >59 mL/min/1.73 Final    Comment:    **In accordance with recommendations from the NKF-ASN Task force,**   [facility] is in the process of updating its eGFR calculation to the   2021 CKD-EPI creatinine equation that estimates kidney function   without a race variable.    GFR calc non Af Amer  Date Value Ref Range Status  12/22/2019 75 >59 mL/min/1.73 Final   eGFR  Date Value Ref Range Status  11/08/2023 69 >59 mL/min/1.73 Final         Passed - Patient is not pregnant      Passed - Last BP in normal range    BP Readings from Last 1 Encounters:  11/08/23 122/70         Passed - Valid encounter within last 6 months    Recent Outpatient Visits           3 weeks ago Major depressive disorder, single episode, in remission   [facility] Delia, Ashland T, NP   7 months ago Major depressive disorder, single episode, in remission   [facility] Rainbow, Melanie DASEN, NP

## 2024-01-22 ENCOUNTER — Encounter: Payer: Self-pay | Admitting: Nurse Practitioner

## 2024-01-22 ENCOUNTER — Other Ambulatory Visit: Payer: Self-pay | Admitting: Nurse Practitioner

## 2024-01-22 MED ORDER — LOSARTAN POTASSIUM-HCTZ 100-25 MG PO TABS: 1.0000 | ORAL_TABLET | Freq: Every day | ORAL | 3 refills | Status: AC

## 2024-01-27 NOTE — Telephone Encounter (Signed)
 Duplicate request, refilled 01/22/24.  Requested Prescriptions  Pending Prescriptions Disp Refills   losartan -hydrochlorothiazide (HYZAAR) 100-25 MG tablet [Pharmacy Med Name: LOSARTAN /HCT TAB 100-25] 90 tablet 2    Sig: TAKE 1 TABLET DAILY     Cardiovascular: ARB + Diuretic Combos Passed - 01/27/2024  3:34 PM      Passed - K in normal range and within 180 days    Potassium  Date Value Ref Range Status  11/08/2023 4.4 3.5 - 5.2 mmol/L Final         Passed - Na in normal range and within 180 days    Sodium  Date Value Ref Range Status  11/08/2023 137 134 - 144 mmol/L Final         Passed - Cr in normal range and within 180 days    Creatinine, Ser  Date Value Ref Range Status  11/08/2023 0.94 0.57 - 1.00 mg/dL Final         Passed - eGFR is 10 or above and within 180 days    GFR calc Af Amer  Date Value Ref Range Status  12/22/2019 87 >59 mL/min/1.73 Final    Comment:    **In accordance with recommendations from the NKF-ASN Task force,**   [facility] is in the process of updating its eGFR calculation to the   2021 CKD-EPI creatinine equation that estimates kidney function   without a race variable.    GFR calc non Af Amer  Date Value Ref Range Status  12/22/2019 75 >59 mL/min/1.73 Final   eGFR  Date Value Ref Range Status  11/08/2023 69 >59 mL/min/1.73 Final         Passed - Patient is not pregnant      Passed - Last BP in normal range    BP Readings from Last 1 Encounters:  11/08/23 122/70         Passed - Valid encounter within last 6 months    Recent Outpatient Visits           2 months ago Major depressive disorder, single episode, in remission   [place] [facility] [place], [place] T, NP   9 months ago Major depressive disorder, single episode, in remission   [place] [facility] [place], Melanie DASEN, NP

## 2024-02-07 DIAGNOSIS — H25013 Cortical age-related cataract, bilateral: Secondary | ICD-10-CM | POA: Diagnosis not present

## 2024-02-07 DIAGNOSIS — H2513 Age-related nuclear cataract, bilateral: Secondary | ICD-10-CM | POA: Diagnosis not present

## 2024-03-21 NOTE — Patient Instructions (Incomplete)
 Be Involved in Caring For Your Health:  Taking Medications When medications are taken as directed, they can greatly improve your health. But if they are not taken as prescribed, they may not work. In some cases, not taking them correctly can be harmful. To help ensure your treatment remains effective and safe, understand your medications and how to take them. Bring your medications to each visit for review by your provider.  Your lab results, notes, and after visit summary will be available on My Chart. We strongly encourage you to use this feature. If lab results are abnormal the clinic will contact you with the appropriate steps. If the clinic does not contact you assume the results are satisfactory. You can always view your results on My Chart. If you have questions regarding your health or results, please contact the clinic during office hours. You can also ask questions on My Chart.  We at [facility] are grateful that you chose us  to provide your care. We strive to provide evidence-based and compassionate care and are always looking for feedback. If you get a survey from the clinic please complete this so we can hear your opinions.  Shoulder Pain Many things can cause shoulder pain, including: An injury. Moving the shoulder in the same way again and again (overuse). Joint pain (arthritis). Pain can come from: Swelling and irritation (inflammation) of any part of the shoulder. An injury to: The shoulder joint. Tissues that connect muscle to bone (tendons). Tissues that connect bones to each other (ligaments). Bones. Follow these instructions at home: Watch for changes in your symptoms. Let your doctor know about them. Follow these instructions to help with your pain. If you have a sling that can be taken off: Wear the sling as told by your doctor. Take it off only as told by your doctor. Check the skin around the sling every day. Tell your doctor if you see  problems. Loosen the sling if your fingers: Tingle. Become numb. Become cold. Keep the sling clean. If the sling is not waterproof: Do not let it get wet. Take the sling off when you shower or bathe. Managing pain, stiffness, and swelling  If told, put ice on the painful area. Put ice in a plastic bag. Place a towel between your skin and the bag. Leave the ice on for 20 minutes, 2-3 times a day. Stop putting ice on if it does not help with the pain. If your skin turns bright red, take off the ice right away to prevent skin damage. The risk of damage is higher if you cannot feel pain, heat, or cold. Squeeze a soft ball or a foam pad as much as possible. This prevents swelling in the shoulder. It also helps to strengthen the arm. General instructions Take over-the-counter and prescription medicines only as told by your doctor. Keep all follow-up visits. This will help you avoid any type of permanent shoulder problems. Contact a doctor if: Your pain gets worse. Medicine does not help your pain. You have new pain in your arm, hand, or fingers. You loosen your sling and your arm, hand, or fingers: Tingle. Are numb. Are swollen. Get help right away if: Your arm, hand, or fingers turn white or blue. This information is not intended to replace advice given to you by your health care provider. Make sure you discuss any questions you have with your health care provider. Document Revised: 09/15/2021 Document Reviewed: 09/15/2021 Elsevier Patient Education  2024 [employer].

## 2024-03-23 ENCOUNTER — Ambulatory Visit: Admitting: Nurse Practitioner

## 2024-03-27 ENCOUNTER — Ambulatory Visit: Admitting: Nurse Practitioner

## 2024-03-28 DIAGNOSIS — I214 Non-ST elevation (NSTEMI) myocardial infarction: Secondary | ICD-10-CM | POA: Insufficient documentation

## 2024-03-28 NOTE — Patient Instructions (Signed)
 Be Involved in Caring For Your Health:  Taking Medications When medications are taken as directed, they can greatly improve your health. But if they are not taken as prescribed, they may not work. In some cases, not taking them correctly can be harmful. To help ensure your treatment remains effective and safe, understand your medications and how to take them. Bring your medications to each visit for review by your provider.  Your lab results, notes, and after visit summary will be available on My Chart. We strongly encourage you to use this feature. If lab results are abnormal the clinic will contact you with the appropriate steps. If the clinic does not contact you assume the results are satisfactory. You can always view your results on My Chart. If you have questions regarding your health or results, please contact the clinic during office hours. You can also ask questions on My Chart.  We at [facility] are grateful that you chose us  to provide your care. We strive to provide evidence-based and compassionate care and are always looking for feedback. If you get a survey from the clinic please complete this so we can hear your opinions.   Healthy Eating for Your Heart Many factors influence your heart health, including eating and exercise habits. Heart health is also called coronary health. Coronary risk increases with abnormal blood fat (lipid) levels. A heart-healthy eating plan includes limiting unhealthy fats, increasing healthy fats, limiting salt (sodium) intake, and making other diet and lifestyle changes. What is my plan? Your health care provider may recommend that: You limit your fat intake to _________% or less of your total calories each day. You limit your saturated fat intake to _________% or less of your total calories each day. You limit the amount of cholesterol in your diet to less than _________ mg per day. You limit the amount of sodium in your diet to less than  _________ mg per day. What are tips for following this plan? Cooking Cook foods using methods other than frying. Baking, boiling, grilling, and broiling are all good options. Other ways to reduce fat include: Removing the skin from poultry. Removing all visible fats from meats. Steaming vegetables in water  or broth. Meal planning  At meals, imagine dividing your plate into fourths: Fill one-half of your plate with vegetables and green salads. Fill one-fourth of your plate with whole grains. Fill one-fourth of your plate with lean protein foods. Eat 2-4 cups of vegetables per day. One cup of vegetables equals 1 cup (91 g) broccoli or cauliflower florets, 2 medium carrots, 1 large bell pepper, 1 large sweet potato, 1 large tomato, 1 medium white potato, 2 cups (150 g) raw leafy greens. Eat 1-2 cups of fruit per day. One cup of fruit equals 1 small apple, 1 large banana, 1 cup (237 g) mixed fruit, 1 large orange,  cup (82 g) dried fruit, 1 cup (240 mL) 100% fruit juice. Eat more foods that contain soluble fiber. Examples include apples, broccoli, carrots, beans, peas, and barley. Aim to get 25-30 g of fiber per day. Increase your consumption of legumes, nuts, and seeds to 4-5 servings per week. One serving of dried beans or legumes equals  cup (90 g) cooked, 1 serving of nuts is  oz (12 almonds, 24 pistachios, or 7 walnut halves), and 1 serving of seeds equals  oz (8 g). Fats Choose healthy fats more often. Choose monounsaturated and polyunsaturated fats, such as olive and canola oils, avocado oil, flaxseeds, walnuts, almonds,  and seeds. Eat more omega-3 fats. Choose salmon, mackerel, sardines, tuna, flaxseed oil, and ground flaxseeds. Aim to eat fish at least 2 times each week. Check food labels carefully to identify foods with trans fats or high amounts of saturated fat. Limit saturated fats. These are found in animal products, such as meats, butter, and cream. Plant sources of saturated  fats include palm oil, palm kernel oil, and coconut oil. Avoid foods with partially hydrogenated oils in them. These contain trans fats. Examples are stick margarine, some tub margarines, cookies, crackers, and other baked goods. Avoid fried foods. General information Eat more home-cooked food and less restaurant, buffet, and fast food. Limit or avoid alcohol. Limit foods that are high in added sugar and simple starches such as foods made using white refined flour (white breads, pastries, sweets). Lose weight if you are overweight. Losing just 5-10% of your body weight can help your overall health and prevent diseases such as diabetes and heart disease. Monitor your sodium intake, especially if you have high blood pressure. Talk with your health care provider about your sodium intake. Try to incorporate more vegetarian meals weekly. What foods should I eat? Fruits All fresh, canned (in natural juice), or frozen fruits. Vegetables Fresh or frozen vegetables (raw, steamed, roasted, or grilled). Green salads. Grains Most grains. Choose whole wheat and whole grains most of the time. Rice and pasta, including brown rice and pastas made with whole wheat. Meats and other proteins Lean, well-trimmed beef, veal, pork, and lamb. Chicken and turkey without skin. All fish and shellfish. Wild duck, rabbit, pheasant, and venison. Egg whites or low-cholesterol egg substitutes. Dried beans, peas, lentils, and tofu. Seeds and most nuts. Dairy Low-fat or nonfat cheeses, including ricotta and mozzarella. Skim or 1% milk (liquid, powdered, or evaporated). Buttermilk made with low-fat milk. Nonfat or low-fat yogurt. Fats and oils Non-hydrogenated (trans-free) margarines. Vegetable oils, including soybean, sesame, sunflower, olive, avocado, peanut, safflower, corn, canola, and cottonseed. Salad dressings or mayonnaise made with a vegetable oil. Beverages Water  (mineral or sparkling). Coffee and tea. Unsweetened  ice tea. Diet beverages. Sweets and desserts Sherbet, gelatin, and fruit ice. Small amounts of dark chocolate. Limit all sweets and desserts. Seasonings and condiments All seasonings and condiments. The items listed above may not be a complete list of foods and beverages you can eat. Contact a dietitian for more options. What foods should I avoid? Fruits Canned fruit in heavy syrup. Fruit in cream or butter sauce. Fried fruit. Limit coconut. Vegetables Vegetables cooked in cheese, cream, or butter sauce. Fried vegetables. Grains Breads made with saturated or trans fats, oils, or whole milk. Croissants. Sweet rolls. Donuts. High-fat crackers, such as cheese crackers and chips. Meats and other proteins Fatty meats, such as hot dogs, ribs, sausage, bacon, rib-eye roast or steak. High-fat deli meats, such as salami and bologna. Caviar. Domestic duck and goose. Organ meats, such as liver. Dairy Cream, sour cream, cream cheese, and creamed cottage cheese. Whole-milk cheeses. Whole or 2% milk (liquid, evaporated, or condensed). Whole buttermilk. Cream sauce or high-fat cheese sauce. Whole-milk yogurt. Fats and oils Meat fat, or shortening. Cocoa butter, hydrogenated oils, palm oil, coconut oil, palm kernel oil. Solid fats and shortenings, including bacon fat, salt pork, lard, and butter. Nondairy cream substitutes. Salad dressings with cheese or sour cream. Beverages Regular sodas and any drinks with added sugar. Sweets and desserts Frosting. Pudding. Cookies. Cakes. Pies. Milk chocolate or white chocolate. Buttered syrups. Full-fat ice cream or ice cream drinks. The items  listed above may not be a complete list of foods and beverages to avoid. Contact a dietitian for more information. Summary Heart-healthy meal planning includes limiting unhealthy fats, increasing healthy fats, limiting salt (sodium) intake and making other diet and lifestyle changes. Lose weight if you are overweight. Losing  just 5-10% of your body weight can help your overall health and prevent diseases such as diabetes and heart disease. Focus on eating a balance of foods, including fruits and vegetables, low-fat or nonfat dairy, lean protein, nuts and legumes, whole grains, and heart-healthy oils and fats. This information is not intended to replace advice given to you by your health care provider. Make sure you discuss any questions you have with your health care provider. Document Revised: 12/23/2023 Document Reviewed: 03/20/2021 Elsevier Patient Education  2025 [employer].

## 2024-03-31 ENCOUNTER — Ambulatory Visit: Admitting: Nurse Practitioner

## 2024-03-31 ENCOUNTER — Encounter: Payer: Self-pay | Admitting: Nurse Practitioner

## 2024-03-31 VITALS — BP 122/78 | HR 69 | Temp 98.0°F | Ht 66.0 in | Wt 181.2 lb

## 2024-03-31 DIAGNOSIS — E1142 Type 2 diabetes mellitus with diabetic polyneuropathy: Secondary | ICD-10-CM

## 2024-03-31 DIAGNOSIS — E1169 Type 2 diabetes mellitus with other specified complication: Secondary | ICD-10-CM

## 2024-03-31 DIAGNOSIS — E1159 Type 2 diabetes mellitus with other circulatory complications: Secondary | ICD-10-CM

## 2024-03-31 DIAGNOSIS — I214 Non-ST elevation (NSTEMI) myocardial infarction: Secondary | ICD-10-CM

## 2024-03-31 DIAGNOSIS — F325 Major depressive disorder, single episode, in full remission: Secondary | ICD-10-CM

## 2024-03-31 LAB — MICROALBUMIN, URINE WAIVED
Creatinine, Urine Waived: 100 mg/dL (ref 10–300)
Microalb, Ur Waived: 10 mg/L (ref 0–19)
Microalb/Creat Ratio: <30 mg/g

## 2024-03-31 MED ORDER — CARVEDILOL 3.125 MG PO TABS: 3.1250 mg | ORAL_TABLET | Freq: Two times a day (BID) | ORAL | 2 refills | Status: AC

## 2024-03-31 MED ORDER — ROSUVASTATIN CALCIUM 20 MG PO TABS: 20.0000 mg | ORAL_TABLET | Freq: Every day | ORAL | 3 refills | Status: AC

## 2024-03-31 MED ORDER — FLUOXETINE HCL 10 MG PO TABS: 10.0000 mg | ORAL_TABLET | Freq: Every day | ORAL | 3 refills | Status: AC

## 2024-03-31 MED ORDER — PRASUGREL HCL 10 MG PO TABS: 10.0000 mg | ORAL_TABLET | Freq: Every day | ORAL | 3 refills | Status: AC

## 2024-03-31 NOTE — Assessment & Plan Note (Signed)
 Chronic, stable.  BP at goal today in office. Will continue Losartan -HCTZ + Carvedilol .  Recommend she monitor BP at least a few mornings a week at home and document.  DASH diet at home.  Labs today: CMP and urine ALB.

## 2024-03-31 NOTE — Assessment & Plan Note (Signed)
 Chronic, stable.  Denies SI/HI. Continue on Prozac  as needed, often uses more in summer time.  Refills as needed. Monitor Prozac  use with her ASA/Prasugrel  and Carvedilol . Discussed with her.

## 2024-03-31 NOTE — Assessment & Plan Note (Signed)
 Acute on 03/23/24 with stent placement x 2. Will continue on Prasugrel  and ASA daily + Carvedilol . Educated her on these and reasons they must be maintained on board. Discussed goals with her to maintain BP <130/80, LDL <55, and A1c <6.5%. Labs today. Referral to local cardiology placed.

## 2024-04-01 ENCOUNTER — Ambulatory Visit: Payer: Self-pay | Admitting: Nurse Practitioner

## 2024-04-01 LAB — LIPID PANEL W/O CHOL/HDL RATIO
Cholesterol, Total: 204 mg/dL — ABNORMAL HIGH (ref 100–199)
HDL: 38 mg/dL — ABNORMAL LOW
LDL Chol Calc (NIH): 116 mg/dL — ABNORMAL HIGH (ref 0–99)
Triglycerides: 288 mg/dL — ABNORMAL HIGH (ref 0–149)
VLDL Cholesterol Cal: 50 mg/dL — ABNORMAL HIGH (ref 5–40)

## 2024-04-01 LAB — COMPREHENSIVE METABOLIC PANEL WITH GFR
ALT: 10 [IU]/L (ref 0–32)
AST: 12 [IU]/L (ref 0–40)
Albumin: 4.5 g/dL (ref 3.9–4.9)
Alkaline Phosphatase: 69 [IU]/L (ref 49–135)
BUN/Creatinine Ratio: 16 (ref 12–28)
BUN: 16 mg/dL (ref 8–27)
Bilirubin Total: 0.2 mg/dL (ref 0.0–1.2)
CO2: 24 mmol/L (ref 20–29)
Calcium: 9.6 mg/dL (ref 8.7–10.3)
Chloride: 101 mmol/L (ref 96–106)
Creatinine, Ser: 0.99 mg/dL (ref 0.57–1.00)
Globulin, Total: 2 g/dL (ref 1.5–4.5)
Glucose: 122 mg/dL — ABNORMAL HIGH (ref 70–99)
Potassium: 4.1 mmol/L (ref 3.5–5.2)
Sodium: 140 mmol/L (ref 134–144)
Total Protein: 6.5 g/dL (ref 6.0–8.5)
eGFR: 64 mL/min/{1.73_m2}

## 2024-04-01 NOTE — Progress Notes (Signed)
 Contacted via MyChart  Good morning Prescilla, your labs have returned: - Kidney function, creatinine and eGFR, remains normal, as is liver function, AST and ALT.  - Lipid panel continues to show elevations. Are you taking Rosuvastatin  every day? Please ensure you are and we will recheck next visit, if ongoing elevations we will need to increase dose of statin. Any questions? Keep being stellar!!  Thank you for allowing me to participate in your care.  I appreciate you. Kindest regards, Kolbi Altadonna

## 2024-05-11 ENCOUNTER — Encounter: Admitting: Nurse Practitioner
